# Patient Record
Sex: Male | Born: 1975
Health system: Southern US, Community
[De-identification: ages and names within clinical notes are randomized; demographics above are authoritative.]

## PROBLEM LIST (undated history)

## (undated) DIAGNOSIS — I10 Essential (primary) hypertension: Secondary | ICD-10-CM

## (undated) DIAGNOSIS — R030 Elevated blood-pressure reading, without diagnosis of hypertension: Secondary | ICD-10-CM

## (undated) HISTORY — DX: Elevated blood-pressure reading, without diagnosis of hypertension: R03.0

## (undated) HISTORY — DX: Essential (primary) hypertension: I10

---

## 2005-12-25 ENCOUNTER — Ambulatory Visit: Payer: Self-pay | Admitting: Family Medicine

## 2006-01-23 ENCOUNTER — Ambulatory Visit: Payer: Self-pay | Admitting: Internal Medicine

## 2006-02-02 ENCOUNTER — Ambulatory Visit: Payer: Self-pay | Admitting: Internal Medicine

## 2007-02-03 ENCOUNTER — Emergency Department (HOSPITAL_COMMUNITY): Admission: EM | Admit: 2007-02-03 | Discharge: 2007-02-03 | Payer: Self-pay | Admitting: Emergency Medicine

## 2007-10-25 ENCOUNTER — Ambulatory Visit: Payer: Self-pay | Admitting: Internal Medicine

## 2007-10-25 DIAGNOSIS — M542 Cervicalgia: Secondary | ICD-10-CM | POA: Insufficient documentation

## 2008-03-27 ENCOUNTER — Telehealth (INDEPENDENT_AMBULATORY_CARE_PROVIDER_SITE_OTHER): Payer: Self-pay | Admitting: *Deleted

## 2008-03-27 ENCOUNTER — Ambulatory Visit: Payer: Self-pay | Admitting: Internal Medicine

## 2008-03-27 DIAGNOSIS — R030 Elevated blood-pressure reading, without diagnosis of hypertension: Secondary | ICD-10-CM | POA: Insufficient documentation

## 2008-03-28 ENCOUNTER — Telehealth: Payer: Self-pay | Admitting: Internal Medicine

## 2008-05-16 ENCOUNTER — Ambulatory Visit: Payer: Self-pay | Admitting: Internal Medicine

## 2008-05-23 ENCOUNTER — Telehealth (INDEPENDENT_AMBULATORY_CARE_PROVIDER_SITE_OTHER): Payer: Self-pay | Admitting: *Deleted

## 2008-12-12 ENCOUNTER — Ambulatory Visit: Payer: Self-pay | Admitting: Internal Medicine

## 2008-12-21 ENCOUNTER — Telehealth: Payer: Self-pay | Admitting: Internal Medicine

## 2009-12-24 ENCOUNTER — Ambulatory Visit: Payer: Self-pay | Admitting: Internal Medicine

## 2010-10-28 ENCOUNTER — Ambulatory Visit: Payer: Self-pay | Admitting: Internal Medicine

## 2010-10-28 DIAGNOSIS — B372 Candidiasis of skin and nail: Secondary | ICD-10-CM | POA: Insufficient documentation

## 2010-10-29 LAB — CONVERTED CEMR LAB: Hgb A1c MFr Bld: 5.1 % (ref 4.6–6.5)

## 2010-12-11 ENCOUNTER — Ambulatory Visit: Payer: Self-pay | Admitting: Internal Medicine

## 2010-12-11 DIAGNOSIS — J329 Chronic sinusitis, unspecified: Secondary | ICD-10-CM | POA: Insufficient documentation

## 2010-12-12 ENCOUNTER — Telehealth: Payer: Self-pay | Admitting: Internal Medicine

## 2011-01-21 NOTE — Assessment & Plan Note (Signed)
Summary: JOCKEY ITCH//PH   Vital Signs:  Patient profile:   35 year old male Height:      68 inches Weight:      206.6 pounds BMI:     31.53 Temp:     98.3 degrees F oral Pulse rate:   56 / minute Resp:     12 per minute BP sitting:   124 / 86  (left arm) Cuff size:   large  Vitals Entered By: Shonna Chock CMA (October 28, 2010 1:36 PM) CC: Personal concerns , Rash, Type 2 diabetes mellitus follow-up   CC:  Personal concerns , Rash, and Type 2 diabetes mellitus follow-up.  History of Present Illness: Rash      This is a 35 year old man who presents with Rash X 3 months.  The patient reports  diffuse  redness, but denies tenderness.  The rash is located on the groin.  The rash is NOT  worse with heat or  worse with sweating.  The patient denies the following symptoms: fever, facial swelling, tongue swelling, abdominal pain, diarrhea, dysuria, eye symptoms, and arthralgias.  The patient denies history of recent infection or  new medication. Rx: [employer] "Tinactin", [place], Lamisil w/o benefit.  The patient denies polyuria, polydipsia, weight loss, and numbness of extremities.  The patient denies the following symptoms: neuropathic pain.  Father had DM.  Current Medications (verified): 1)  None  Allergies: 1)  ! Pcn 2)  ! Augmentin 3)  ! Prednisone  Physical Exam  General:  well-nourished,in no acute distress; alert,appropriate and cooperative throughout examination Abdomen:  Bowel sounds positive,abdomen soft and non-tender without masses, organomegaly . Small , reducible ventral  hernia  noted. Pulses:  R and L radial,dorsalis pedis and posterior tibial pulses are full and equal bilaterally Extremities:  No clubbing, cyanosis, edema. Good nail health Neurologic:  alert & oriented X3 and sensation intact to light touch over feet   Skin:  Mild erythema on scrotum Cervical Nodes:  No lymphadenopathy noted Axillary Nodes:  No palpable lymphadenopathy   Impression &  Recommendations:  Problem # 1:  CANDIDIASIS, SKIN (ICD-112.3)  Orders: Venipuncture (16109) TLB-A1C / Hgb A1C (Glycohemoglobin) (83036-A1C) Specimen Handling (60454)  Problem # 2:  DIABETES MELLITUS, FAMILY HX (ICD-V18.0)  Orders: Venipuncture (09811) TLB-A1C / Hgb A1C (Glycohemoglobin) (83036-A1C) Specimen Handling (91478)  Patient Instructions: 1)  Avoid High Fructose Corn Syrup Corn Syrup ; consume < 40 grams of HFCS sugar/ day. Use hairdrier after applying Nizoral.   Orders Added: 1)  Est. Patient Level III [29562] 2)  Venipuncture [36415] 3)  TLB-A1C / Hgb A1C (Glycohemoglobin) [83036-A1C] 4)  Specimen Handling [99000]

## 2011-01-21 NOTE — Assessment & Plan Note (Signed)
Summary: sinus infection/kdc   Vital Signs:  Patient profile:   35 year old male Weight:      206 pounds Temp:     98.3 degrees F oral Pulse rate:   72 / minute Resp:     14 per minute BP sitting:   128 / 80  (left arm) Cuff size:   large  Vitals Entered By: Shonna Chock (December 24, 2009 12:16 PM) CC: Sinus Infection Symptoms off/on x 2 weeks Comments REVIEWED MED LIST, PATIENT AGREED DOSE AND INSTRUCTION CORRECT    CC:  Sinus Infection Symptoms off/on x 2 weeks.  History of Present Illness: Sick " on & off "  X 2 weeks; now with maxillary pain with green/ yellow discharge. Rx: Neti pot , Mucinex D.  Allergies: 1)  ! Pcn 2)  ! Augmentin 3)  ! Prednisone  Review of Systems General:  Denies chills, fever, and sweats. Eyes:  Denies discharge, eye pain, and red eye. ENT:  Complains of nasal congestion, postnasal drainage, and sinus pressure; denies ear discharge, earache, and sore throat; Some anosmia; am ST . No frontal headache . Resp:  Complains of cough; denies sputum productive; PNDrainage causes .  Physical Exam  General:  well-nourished,in no acute distress; alert,appropriate and cooperative throughout examination Eyes:  No corneal or conjunctival inflammation noted. Perrla. Ears:  External ear exam shows no significant lesions or deformities.  Otoscopic examination reveals clear canals, tympanic membranes are intact bilaterally without bulging, retraction, inflammation or discharge. Hearing is grossly normal bilaterally. Some wax Nose:  External nasal examination shows no deformity or inflammation. Nasal mucosa are pink and moist without lesions or exudates. Mouth:  Oral mucosa and oropharynx without lesions or exudates.  Teeth in good repair.Slight hoarseness Lungs:  Normal respiratory effort, chest expands symmetrically. Lungs are clear to auscultation, no crackles or wheezes. Cervical Nodes:  No lymphadenopathy noted Axillary Nodes:  No palpable  lymphadenopathy   Impression & Recommendations:  Problem # 1:  SINUSITIS- ACUTE-NOS (ICD-461.9)  The following medications were removed from the medication list:    Metronidazole 500 Mg Tabs (Metronidazole) .Marland Kitchen... 1 three times a day His updated medication list for this problem includes:    Smz-tmp Ds 800-160 Mg Tabs (Sulfamethoxazole-trimethoprim) .Marland Kitchen... 1 two times a day with 8 oz of water  Complete Medication List: 1)  Smz-tmp Ds 800-160 Mg Tabs (Sulfamethoxazole-trimethoprim) .Marland Kitchen.. 1 two times a day with 8 oz of water  Patient Instructions: 1)  Neti pot once daily - two times a day as needed . Plain Mucinex , not D 2)  Drink as much fluid as you can tolerate for the next few days. Prescriptions: SMZ-TMP DS 800-160 MG TABS (SULFAMETHOXAZOLE-TRIMETHOPRIM) 1 two times a day with 8 oz of water  #20 x 0   Entered and Authorized by:   Marga Melnick MD   Signed by:   Marga Melnick MD on 12/24/2009   Method used:   [facility]ed to ...       [facility] [facility] [address] [id number]       [address]       [place], [place]  [address]       Ph: [phone]       Fax: [phone]   RxID:   [id number]

## 2011-01-23 NOTE — Progress Notes (Signed)
Summary: ABX--ER not avail  Phone Note Refill Request Message from:  Pharmacy on December 12, 2010 3:17 PM  Refills Requested: Medication #1:  CLARITHROMYCIN 500 MG XR24H-TAB 2 once daily with a meal. ER is not avail, is it ok to dispense regular instead? Please advise.  Initial call taken by: Lucious Groves CMA,  December 12, 2010 3:17 PM  Follow-up for Phone Call        [facility] per MD, new prescription sent. Lucious Groves CMA  December 12, 2010 4:37 PM     New/Updated Medications: CLARITHROMYCIN 500 MG TABS (CLARITHROMYCIN) 2 by mouth once daily with a meal Prescriptions: CLARITHROMYCIN 500 MG TABS (CLARITHROMYCIN) 2 by mouth once daily with a meal  #20 x 0   Entered by:   Lucious Groves CMA   Authorized by:   Marga Melnick MD   Signed by:   Lucious Groves CMA on 12/12/2010   Method used:   [facility]        [facility]  [address] [address] [id number]       [address]       [place], [place]  [address]       Ph: [phone]       Fax: [phone]   RxID:   [id number]

## 2011-01-23 NOTE — Assessment & Plan Note (Signed)
Summary: SINUS/KN   Vital Signs:  Patient profile:   35 year old male Weight:      208.2 pounds BMI:     31.77 Temp:     98.4 degrees F oral Pulse rate:   60 / minute Resp:     14 per minute BP sitting:   138 / 96  (left arm) Cuff size:   large  Vitals Entered By: Shonna Chock CMA (December 11, 2010 10:45 AM) CC: Sinuses , URI symptoms   CC:  Sinuses  and URI symptoms.  History of Present Illness:      This is a 35 year old man who presents with URI symptoms; onset 12/10 as ST.  The patient now reports  scant  purulent nasal discharge with slight epistaxis, but denies productive cough and earache.  The patient denies fever, dyspnea, and wheezing.  The patient also reports slight headache.  Risk factors for Strep sinusitis include bilateral facial pain and tooth pain.  The patient denies the following risk factors for Strep sinusitis: tender adenopathy.  Rx: Neti pot two times a day   Current Medications (verified): 1)  None  Allergies: 1)  ! Pcn 2)  ! Augmentin 3)  ! Prednisone  Review of Systems Derm:  Complains of rash; Inguina rash no better with Nizoral two times a day .  Physical Exam  General:  in no acute distress; alert,appropriate and cooperative throughout examination Ears:  External ear exam shows no significant lesions or deformities.  Otoscopic examination reveals clear canals, tympanic membranes are intact bilaterally without bulging, retraction, inflammation or discharge. Hearing is grossly normal bilaterally. Nose:  External nasal examination shows no deformity or inflammation. Nasal mucosa  mildly erythematous without lesions or exudates. Septum to R Mouth:  Oral mucosa and oropharynx without lesions or exudates.  Teeth in good repair. Lungs:  Normal respiratory effort, chest expands symmetrically. Lungs are clear to auscultation, no crackles or wheezes. Skin:  faint rash in inguinal crease bilaterally Cervical Nodes:  No lymphadenopathy noted Axillary  Nodes:  No palpable lymphadenopathy   Impression & Recommendations:  Problem # 1:  SINUSITIS, ACUTE (ICD-461.9)  His updated medication list for this problem includes:    Clarithromycin 500 Mg Xr24h-tab (Clarithromycin) .Marland Kitchen... 2 once daily with a meal  Problem # 2:  CANDIDIASIS, SKIN (ICD-112.3)  no improvement with Nizoral; A1c WNL  Orders: Dermatology Referral (Derma)  Complete Medication List: 1)  Clarithromycin 500 Mg Xr24h-tab (Clarithromycin) .... 2 once daily with a meal  Patient Instructions: 1)  Drink as much NON  dairy  fluid as you can tolerate for the next few days. Prescriptions: CLARITHROMYCIN 500 MG XR24H-TAB (CLARITHROMYCIN) 2 once daily with a meal  #20 x 0   Entered and Authorized by:   Marga Melnick MD   Signed by:   Marga Melnick MD on 12/11/2010   Method used:   [facility]ed to ...       [facility] [id number]       [address]       [place], [place]  [address]       Ph: [phone]       Fax: [phone]   RxID:   [id number]    Orders Added: 1)  Est. Patient Level III [[address]] 2)  Dermatology Referral [Derma]

## 2011-02-28 ENCOUNTER — Encounter: Payer: Self-pay | Admitting: Internal Medicine

## 2011-10-08 ENCOUNTER — Encounter: Payer: Self-pay | Admitting: Internal Medicine

## 2011-10-08 ENCOUNTER — Ambulatory Visit (INDEPENDENT_AMBULATORY_CARE_PROVIDER_SITE_OTHER): Payer: BC Managed Care – PPO | Admitting: Internal Medicine

## 2011-10-08 VITALS — HR 69 | Temp 98.2°F | Wt 201.4 lb

## 2011-10-08 DIAGNOSIS — J0100 Acute maxillary sinusitis, unspecified: Secondary | ICD-10-CM

## 2011-10-08 MED ORDER — FLUTICASONE PROPIONATE 50 MCG/ACT NA SUSP: 1.0000 | NASAL | Status: DC

## 2011-10-08 MED ORDER — SULFAMETHOXAZOLE-TMP DS 800-160 MG PO TABS: 1.0000 | ORAL_TABLET | Freq: Two times a day (BID) | ORAL | Status: AC

## 2011-10-08 NOTE — Progress Notes (Signed)
  Subjective:    Patient ID: Julian Swanson, male    DOB: Dec 09, 1976, 35 y.o.   MRN: [id number]  HPI Respiratory tract infection Onset/symptoms:10/11 as PNDrainage Exposures (illness/environmental/extrinsic): cool weather  Progression of symptoms:to cough & head congestion Treatments/response:Neti pot; Advil Sinus & Allergy Present symptoms: Fever/chills/sweats:no Frontal headache:no Facial pain:yes Nasal purulence:slight yellow Sore throat:not now Dental pain:minor with waist flexion Lymphadenopathy:no Wheezing/shortness of breath:no Cough/sputum/hemoptysis:essentially NP cough, Associated extrinsic/allergic symptoms:itchy eyes/ sneezing:no Past medical history: Seasonal allergies:no/asthma:no Smoking history:never           Review of Systems     Objective:   Physical Exam General appearance is of good health and nourishment; no acute distress or increased work of breathing is present.  Minor  lymphadenopathy L neck; no  Axillary LA   Eyes: No conjunctival inflammation or lid edema is present.   Ears:  External ear exam shows no significant lesions or deformities.  Otoscopic examination reveals clear canals, tympanic membranes are intact bilaterally without bulging, retraction, inflammation or discharge.  Nose:  External nasal examination shows no deformity or inflammation. Nasal mucosa are dry without lesions or exudates. No septal dislocation or dislocation.No obstruction to airflow.   Oral exam: Dental hygiene is good; lips and gums are healthy appearing.There is no oropharyngeal erythema or exudate noted.     Heart:  Normal rate and regular rhythm. S1 and S2 normal without gallop,  click. Sharp  S4 w/o murmur Lungs:Chest clear to auscultation; no wheezes, rhonchi,rales ,or rubs present.No increased work of breathing.    Extremities:  No cyanosis, edema, or clubbing  noted    Skin: Warm & dry w/o jaundice or tenting.          Assessment & Plan:  #1  maxillary sinusitis, acute  #2 penicillin allergy Plan: See orders and recommendations

## 2011-10-08 NOTE — Patient Instructions (Signed)
Plain Mucinex for thick secretions ;force NON dairy fluids for next 48 hrs. Use a Neti pot daily as needed for sinus congestion 

## 2012-07-14 ENCOUNTER — Ambulatory Visit (INDEPENDENT_AMBULATORY_CARE_PROVIDER_SITE_OTHER): Payer: BC Managed Care – PPO | Admitting: Internal Medicine

## 2012-07-14 ENCOUNTER — Encounter: Payer: Self-pay | Admitting: Internal Medicine

## 2012-07-14 VITALS — BP 128/80 | HR 72 | Temp 98.4°F | Wt 202.8 lb

## 2012-07-14 DIAGNOSIS — J0120 Acute ethmoidal sinusitis, unspecified: Secondary | ICD-10-CM

## 2012-07-14 MED ORDER — SULFAMETHOXAZOLE-TRIMETHOPRIM 800-160 MG PO TABS: 1.0000 | ORAL_TABLET | Freq: Two times a day (BID) | ORAL | Status: AC

## 2012-07-14 NOTE — Patient Instructions (Addendum)
Plain Mucinex for thick secretions ;force NON dairy fluids . Use a Neti pot daily as needed for sinus congestion; going from open side to congested side . Nasal cleansing in the shower as discussed. Make sure that all residual soap is removed to prevent irritation.  Plain Allegra 160 daily as needed for itchy eyes & sneezing.    

## 2012-07-14 NOTE — Progress Notes (Signed)
  Subjective:    Patient ID: Julian Swanson, male    DOB: September 04, 1976, 36 y.o.   MRN: [id number]  HPI  He mowed his lawn 7/15; he awoke early the morning of 7/16 with a sore throat. This was associated with some postnasal drainage and progression of the sore throat over several days. This was also associated with malaise. As of 7/20 he had some hyponasal speech pattern. He describes facial pain and scant nasal purulence. He also has minimal sputum production, mainly in the morning.  He has been applying his Neti pot for nasal gavage.Advil congestion did not help    Review of Systems He denies frontal headache, itchy/watery eyes, sneezing. He is not having wheezing or shortness of breath.     Objective:   Physical Exam General appearance:good health ;well nourished; no acute distress or increased work of breathing is present.  No  lymphadenopathy about the head, neck, or axilla noted.   Eyes: No conjunctival inflammation or lid edema is present.   Ears:  External ear exam shows no significant lesions or deformities.  Otoscopic examination reveals clear canals, tympanic membranes are intact bilaterally without bulging, retraction, inflammation or discharge.  Nose:  External nasal examination shows no deformity or inflammation. Nasal mucosa are pink and moist without lesions or exudates. R septal deviation.No obstruction to airflow.   Oral exam: Dental hygiene is good; lips and gums are healthy appearing.There is no oropharyngeal erythema or exudate noted.    Heart:  Normal rate and regular rhythm. S1 and S2 normal without gallop,  click, rub.S4;Grade 1/2-1 over 6 systolic murmur  Lungs:Chest clear to auscultation; no wheezes, rhonchi,rales ,or rubs present.No increased work of breathing.    Extremities:  No cyanosis, edema, or clubbing  noted    Skin: Warm & dry           Assessment & Plan:  #1 upper respiratory tract infection; rule out ethmoid sinusitis.  Plan: See orders and  recommendations

## 2013-11-11 ENCOUNTER — Telehealth: Payer: Self-pay | Admitting: Internal Medicine

## 2013-11-11 NOTE — Telephone Encounter (Signed)
Patient called and wanted dr hopper recommendations on a good Cardiologist for his father.

## 2013-11-15 NOTE — Telephone Encounter (Signed)
Spoke with the pt and informed him of Dr. Frederik Pear note below.  Pt understood and to tell Dr. Alwyn Ren he said Thank You.//AB/CMA

## 2013-11-15 NOTE — Telephone Encounter (Signed)
It depends on the nature of his heart problem;ie rhythm problem vs coronary artery disease. All heart subspecialists are in the [facility] group now so his father would be directed to the correct specialist based upon review of his medical records.

## 2013-11-15 NOTE — Telephone Encounter (Signed)
Please advise.//AB/CMA 

## 2013-12-12 ENCOUNTER — Telehealth: Payer: Self-pay | Admitting: *Deleted

## 2013-12-12 NOTE — Telephone Encounter (Signed)
error 

## 2013-12-13 ENCOUNTER — Encounter: Payer: Self-pay | Admitting: Internal Medicine

## 2013-12-13 ENCOUNTER — Ambulatory Visit (INDEPENDENT_AMBULATORY_CARE_PROVIDER_SITE_OTHER): Payer: BC Managed Care – PPO | Admitting: Internal Medicine

## 2013-12-13 VITALS — BP 138/81 | HR 79 | Temp 98.2°F | Wt 212.2 lb

## 2013-12-13 DIAGNOSIS — J209 Acute bronchitis, unspecified: Secondary | ICD-10-CM

## 2013-12-13 MED ORDER — HYDROCODONE-HOMATROPINE 5-1.5 MG/5ML PO SYRP: 5.0000 mL | ORAL_SOLUTION | Freq: Four times a day (QID) | ORAL | Status: DC | PRN

## 2013-12-13 MED ORDER — AZITHROMYCIN 250 MG PO TABS: ORAL_TABLET | ORAL | Status: DC

## 2013-12-13 NOTE — Progress Notes (Signed)
Pre visit review using our clinic review tool, if applicable. No additional management support is needed unless otherwise documented below in the visit note. 

## 2013-12-13 NOTE — Patient Instructions (Signed)

## 2013-12-13 NOTE — Progress Notes (Signed)
   Subjective:    Patient ID: Julian Swanson, male    DOB: [birth year], 37 y.o.   MRN: [id number]  HPI   He has had variable symptoms over 3 weeks span. Initially had a dry cough which has waxed and waned. As of today he is having some green/yellow sputum. He's also had some scant purulent material from his sinuses.  Actually has had some sinus pressure in the frontal & axillary areas for several months related to changes in barometric pressure and temperature.  The symptoms have been improved at times. On 9/15 he noted increased sensation of sensitivity of his forearms associated  with significant aching from neck down overnight. He has not had the flu shot.   Review of Systems   At this time he denies significant frontal or maxillary sinus pain. He also has no otic pain or discharge. He questions whether there might be some wheezing associated with the cough.Extrinsic symptoms are minor.     Objective:   Physical Exam General appearance:good health ;well nourished; no acute distress or increased work of breathing is present.  No  lymphadenopathy about the head, neck, or axilla noted.   Eyes: No conjunctival inflammation or lid edema is present.   Ears:  External ear exam shows no significant lesions or deformities.  Otoscopic examination reveals clear canals, tympanic membranes are intact bilaterally without bulging, retraction, inflammation or discharge.  Nose:  External nasal examination shows no deformity or inflammation. Nasal mucosa are pink and moist without lesions or exudates. No septal dislocation or deviation.No obstruction to airflow.   Oral exam: Dental hygiene is good; lips and gums are healthy appearing.There is no oropharyngeal erythema or exudate noted.   Neck:  No deformities, thyromegaly, masses, or tenderness noted.   Supple with full range of motion without pain.   Heart:  Normal rate and regular rhythm. S1 and S2 normal without gallop, murmur, click, rub or  other extra sounds.   Lungs:Chest clear to auscultation; no wheezes, rhonchi,rales ,or rubs present.No increased work of breathing.  Dry cough  Extremities:  No cyanosis, edema, or clubbing  noted    Skin: Damp         Assessment & Plan:  #1 bronchitis See orders

## 2013-12-19 ENCOUNTER — Telehealth: Payer: Self-pay | Admitting: Internal Medicine

## 2013-12-19 ENCOUNTER — Other Ambulatory Visit: Payer: Self-pay | Admitting: *Deleted

## 2013-12-19 MED ORDER — CLARITHROMYCIN ER 500 MG PO TB24: ORAL_TABLET | ORAL | Status: DC

## 2013-12-19 NOTE — Telephone Encounter (Signed)
Please advise 

## 2013-12-19 NOTE — Telephone Encounter (Signed)
Patient called stating he has finished antibiotics and is still not better. He would like to know if he needs another round or to come back into the office. [facility] uses [facility]

## 2013-12-19 NOTE — Telephone Encounter (Signed)
Medication called into pharmacy. JG//CMA

## 2013-12-19 NOTE — Telephone Encounter (Signed)
Generic Biaxin XL 500 #14 WITH A MEAL

## 2014-05-08 ENCOUNTER — Encounter: Payer: Self-pay | Admitting: Internal Medicine

## 2014-05-08 ENCOUNTER — Ambulatory Visit (INDEPENDENT_AMBULATORY_CARE_PROVIDER_SITE_OTHER): Payer: BC Managed Care – PPO | Admitting: Internal Medicine

## 2014-05-08 VITALS — BP 138/100 | HR 58 | Temp 98.3°F | Wt 208.8 lb

## 2014-05-08 DIAGNOSIS — J0100 Acute maxillary sinusitis, unspecified: Secondary | ICD-10-CM

## 2014-05-08 MED ORDER — SULFAMETHOXAZOLE-TMP DS 800-160 MG PO TABS: 1.0000 | ORAL_TABLET | Freq: Two times a day (BID) | ORAL | Status: DC

## 2014-05-08 NOTE — Progress Notes (Signed)
Pre visit review using our clinic review tool, if applicable. No additional management support is needed unless otherwise documented below in the visit note. 

## 2014-05-08 NOTE — Patient Instructions (Signed)

## 2014-05-08 NOTE — Progress Notes (Signed)
   Subjective:    Patient ID: Julian Swanson, male    DOB: 04-Aug-1976, 38 y.o.   MRN: [id number]  HPI   Symptoms began 04/27/14 as rhinitis and sore throat. Symptoms progressed to the point where he has had facial sinus pain associated with expectoration of yellow secretions with some blood tinging.   Purulence began  5/14.  He has used Tylenol Sinus and Sinus Pain Relief with only partial response   Review of Systems  He specifically denies significant frontal sinus pain, dental pain, otic pain, or otic discharge  The cough is related to postnasal drainage and is not associated with wheezing or shortness of breath  He has no associated fever, chills, or sweats  No extrinsic symptoms of itchy, watery eyes.       Objective:   Physical Exam General appearance:good health ;well nourished; no acute distress or increased work of breathing is present.  No  lymphadenopathy about the head, neck, or axilla noted.   Eyes: No conjunctival inflammation or lid edema is present. There is no scleral icterus.  Ears:  External ear exam shows no significant lesions or deformities.  Otoscopic examination reveals clear canals, tympanic membranes are intact bilaterally without bulging, retraction, inflammation or discharge.  Nose:  External nasal examination shows no deformity or inflammation. Nasal mucosa are dry without lesions or exudates. Slight septal deviation.Minimal obstruction to airflow.   Oral exam: Dental hygiene is good; lips and gums are healthy appearing.There is no oropharyngeal erythema or exudate noted.   Neck:  No deformities, thyromegaly, masses, or tenderness noted.   Supple with full range of motion without pain.   Heart:  Normal rate and regular rhythm. S1 and S2 normal without gallop, murmur, click, rub or other extra sounds.   Lungs:Chest clear to auscultation; no wheezes, rhonchi,rales ,or rubs present.No increased work of breathing.    Extremities:  No cyanosis, edema,  or clubbing  noted    Skin: Warm & dry          Assessment & Plan:  #1 rhinosinusitis without significant bronchitis  Plan: Nasal hygiene interventions discussed. See prescription medications

## 2014-05-19 ENCOUNTER — Ambulatory Visit (INDEPENDENT_AMBULATORY_CARE_PROVIDER_SITE_OTHER): Payer: BC Managed Care – PPO | Admitting: Internal Medicine

## 2014-05-19 ENCOUNTER — Encounter: Payer: Self-pay | Admitting: Internal Medicine

## 2014-05-19 VITALS — BP 146/90 | HR 81 | Temp 98.0°F | Wt 209.0 lb

## 2014-05-19 DIAGNOSIS — L500 Allergic urticaria: Secondary | ICD-10-CM

## 2014-05-19 MED ORDER — RANITIDINE HCL 150 MG PO TABS: 150.0000 mg | ORAL_TABLET | Freq: Two times a day (BID) | ORAL | Status: DC

## 2014-05-19 MED ORDER — METHYLPREDNISOLONE (PAK) 4 MG PO TABS: ORAL_TABLET | ORAL | Status: DC

## 2014-05-19 MED ORDER — HYDROXYZINE HCL 10 MG PO TABS: 10.0000 mg | ORAL_TABLET | Freq: Four times a day (QID) | ORAL | Status: DC | PRN

## 2014-05-19 NOTE — Progress Notes (Signed)
   Subjective:    Patient ID: Julian Swanson, male    DOB: [birth year], 38 y.o.   MRN: [id number]  HPI  He is completing his last day of generic Septra DS. As of 05/16/14 he began to have erythematous papules or urticarial type changes at the right wrist and elbow.  He questioned whether this might be related to working in yard with  possible poison ivy exposures;but he has not done yard work for 2 weeks .  After showering today he noted urticarial lesions around his waist.      Review of Systems  He denies itchy, watery eyes, sneezing  He's had no swelling of the lips or tongue  He denies any intraoral blisters or lesions  He has no shortness of breath, cough, or wheezing       Objective:   Physical Exam General appearance:good health ;well nourished; no acute distress or increased work of breathing is present.  No  lymphadenopathy about the head, neck, or axilla noted.   Eyes: No conjunctival inflammation or lid edema (see Skin below) is present. There is no scleral icterus.  Ears:  External ear exam shows no significant lesions or deformities.  Otoscopic examination reveals clear canals, tympanic membranes are intact bilaterally without bulging, retraction, inflammation or discharge.  Nose:  External nasal examination shows no deformity or inflammation. Nasal mucosa are pink and moist without lesions or exudates. No septal dislocation or deviation.No obstruction to airflow.   Oral exam: Dental hygiene is good; lips and gums are healthy appearing.There is no oropharyngeal erythema or exudate noted. No intraoral blisters.  Neck:  No deformities, thyromegaly, masses, or tenderness noted.   Supple with full range of motion without pain.   Heart:  Normal rate and regular rhythm. S1 and S2 normal without gallop, murmur, click, rub or other extra sounds.   Lungs:Chest clear to auscultation; no wheezes, rhonchi,rales ,or rubs present.No increased work of breathing.     Extremities:  No cyanosis, edema, or clubbing  noted    Skin: Warm & dry.  He has a classic urticaria circumferentially around the waist below his belt.  He has faint erythematous plaque-like lesions of the medial upper lids bilaterally.  He has scattered papular urticarial lesions at the right wrist & right elbow.  Dermatographia is elicitable.          Assessment & Plan:  #1 urticarial reaction to generic Septra DS .There is no evidence of Stevens-Johnson syndrome or angioedema phenomena.  The chart lists intolerance to prednisone. He has no knowledge of any reaction to prednisone in the past.  See orders and recommendations

## 2014-05-19 NOTE — Progress Notes (Signed)
   Subjective:    Patient ID: Julian Swanson, male    DOB: 11/17/1976, 38 y.o.   MRN: [id number]  HPI Pt reports the hives appeared 05/17/14.  Today is D9 of 10 of Bactrim that was prescribed to the patient last week for a URI.  The pt is not aware of a sulfa allergy. The hives are diffusely scattered across his body and more recently his face.  The hives are itchy. The problem has worsened over the past two days. He has not reported any angioedema or SOB. He does report a non-productive cough that started last night. Pt took children's benadryl last night and had only mild relief. The pt also reports he had some indigestion last night at bedtime and felt "gassy". The patient did not eat any spicy or fatty foods last night, and denies consuming any known aggravating foods. This problem is persisting this morning but seems to be better. The pt denies any exposures to plants, animals, or new clothing.   Review of Systems Resp: Denies SOB  Immune: denies angioedema    Objective:   Physical Exam Gen.: Healthy and well-nourished in appearance. Alert, appropriate and cooperative throughout exam. Head: Normocephalic without obvious abnormalities Ears: External  ear exam reveals no significant lesions or deformities. Canals clear .TMs normal. Hearing is grossly normal bilaterally. Nose: External nasal exam reveals no deformity or inflammation. Nasal mucosa are pink and moist. No lesions or exudates noted.   Mouth: Oral mucosa and oropharynx reveal no lesions or exudates. Teeth in good repair. Neck: No deformities, masses, or tenderness noted. Range of motion. Lungs: Normal respiratory effort; chest expands symmetrically. Lungs are clear to auscultation without rales, wheezes, or increased work of breathing. Heart: Normal rate and rhythm. Split S1 and S2. No gallop, click, or rub. No murmur. Neurologic: Alert and oriented x3     Skin: diffuse hives located on elbows, wrists, waistline, face, eyelids  and head.  Lymph: No cervical, axillary, or inguinal lymphadenopathy present. Psych: Mood and affect are normal. Normally interactive                                                                                   Assessment & Plan:  #1 allergic reaction; most likely due to Bactrim.   #2 SJS; no oral lesions indicative   Pt to stop taking bactrim. Will give a course of steroids, zantac, and hydroxyzine. Pt has prednisone listed as an allergy but does not recall his reaction in the past. Will check with pharmacist. In the meantime will give medrol pack. Advised pt to report to nearest ED if symptoms persist or if pt notes new reaction with introduction of steroids.

## 2014-05-19 NOTE — Patient Instructions (Addendum)
Avoid  cosmetics which are not hypoallergenic. Restrict hyperallergenic foods at this time: Nuts, strawberries, seafood , chocolate, and tomatoes. Because of a history of documented adverse serious drug reaction;Medi Alert bracelet  is recommended

## 2014-05-19 NOTE — Progress Notes (Signed)
Pre visit review using our clinic review tool, if applicable. No additional management support is needed unless otherwise documented below in the visit note. 

## 2014-05-22 ENCOUNTER — Telehealth: Payer: Self-pay | Admitting: *Deleted

## 2014-05-22 NOTE — Telephone Encounter (Signed)
[facility] Triage Call Report Triage Record Num: [id number] Operator: Alphonsa Overall Patient Name: Julian Swanson Call Date & Time: 05/19/2014 12:07:06AM Patient Phone: [phone] PCP: Marga Melnick Patient Gender: Male PCP Fax : [phone] Patient DOB: May 20, 1976 Practice Name: [facility] Reason for Call: Pt calling PCP: Marga Melnick; CB#: [phone]; Call regarding Rash/Hives on abx Sulfamethoxazole since 05/08/14 for sinuses-improved. Onset rash 05/17/14. Worsening 05/18/14. Mosquito bite like rash. New onset hives after new medication.Call within 4hours as needed. Protocol(s) Used: Hives Recommended Outcome per Protocol: Call Provider within 4 Hours Reason for Outcome: New onset of hives after beginning new prescribed, nonprescribed, or alternative/complementary medication Care Advice: Cool/tepid showers or baths may help relieve itching. If cool water alone does not relieve itching, try adding 1/2 to 1 cup baking soda or colloidal oatmeal (Aveeno) to bath water. ~ ~ Speak with provider before next dose of medication is due. ~ Should not be alone for the next 24 hours in case symptoms worsen. ~ IMMEDIATE ACTION ~ List, or take, all current prescription(s), nonprescription or alternative medication(s) to provider for evaluation. If an allergy is identified, tell all healthcare providers of your allergy. Even if a first-time reaction caused mild symptoms, a future response to the same allergen may cause more serious symptoms. Wear medical identification to alert others in case of an emergency. ~ Call EMS 911 if develop signs and symptoms of anaphylaxis within minutes to several hours of exposure: severe difficulty breathing; rapid, weak or irregular pulse; pruritus, urticaria, swelling of face, lips, tongue, or throat causing tightness or difficulty swallowing; abdominal cramping, nausea, vomiting or diarrhea. ~ If immediately available, consider taking an appropriate dose of  a nonprescription antihistamine (e.g., Benadryl) orally as directed by the label or a pharmacist. These medications may cause drowsiness and should be taken with caution by adults 75 years or older. ~ 05/19/2014 12:19:49AM Page 1 of 1 [id number]

## 2014-05-25 ENCOUNTER — Telehealth: Payer: Self-pay | Admitting: *Deleted

## 2014-05-25 NOTE — Telephone Encounter (Signed)
Pt called states he has completed the Steroid Rx, he is requesting whether he needs to continue the Hydroxyzine and Zantac Rx.  Please advise

## 2014-05-25 NOTE — Telephone Encounter (Signed)
Spoke with pt advised of MDs message 

## 2014-05-25 NOTE — Telephone Encounter (Signed)
Not if rash & itching gone

## 2014-12-13 ENCOUNTER — Ambulatory Visit (INDEPENDENT_AMBULATORY_CARE_PROVIDER_SITE_OTHER): Payer: BC Managed Care – PPO | Admitting: Internal Medicine

## 2014-12-13 ENCOUNTER — Encounter: Payer: Self-pay | Admitting: Internal Medicine

## 2014-12-13 ENCOUNTER — Other Ambulatory Visit (INDEPENDENT_AMBULATORY_CARE_PROVIDER_SITE_OTHER): Payer: BC Managed Care – PPO

## 2014-12-13 VITALS — BP 150/110 | HR 79 | Temp 98.1°F | Ht 68.0 in | Wt 207.5 lb

## 2014-12-13 DIAGNOSIS — R030 Elevated blood-pressure reading, without diagnosis of hypertension: Secondary | ICD-10-CM

## 2014-12-13 LAB — BASIC METABOLIC PANEL
BUN: 15 mg/dL (ref 6–23)
CHLORIDE: 102 meq/L (ref 96–112)
CO2: 29 mEq/L (ref 19–32)
CREATININE: 1.1 mg/dL (ref 0.4–1.5)
Calcium: 9.7 mg/dL (ref 8.4–10.5)
GFR: 75.43 mL/min (ref >60.00)
Glucose, Bld: 84 mg/dL (ref 70–99)
POTASSIUM: 4.2 meq/L (ref 3.5–5.1)
Sodium: 137 mEq/L (ref 135–145)

## 2014-12-13 MED ORDER — METOPROLOL TARTRATE 25 MG PO TABS: 25.0000 mg | ORAL_TABLET | Freq: Two times a day (BID) | ORAL | Status: DC

## 2014-12-13 NOTE — Progress Notes (Signed)
   Subjective:    Patient ID: Julian Swanson, male    DOB: [birth year], 38 y.o.   MRN: [id number]  HPI  He [facility] because of a blood pressure of 154/110.  BP was checked at work and was 162/120.  He has started a natural product to treat HTN  He is asymptomatic.  Both parents had hypertension. There is no family history heart attack or stroke.  Review of Systems   Chest pain, palpitations, tachycardia, exertional dyspnea, paroxysmal nocturnal dyspnea, claudication or edema are absent.      Objective:   Physical Exam  Positive pertinent findings include: He is a small epigastric ventral hernia which is partially reducible.  Appears healthy and well-nourished & in no acute distress  No carotid bruits are present.No neck vein distention present at 10 - 15 degrees. Thyroid normal to palpation  Heart rhythm and rate are normal with no gallop or murmur  Chest is clear with no increased work of breathing  There is no evidence of aortic aneurysm or renal artery bruits  Abdomen soft with no organomegaly or masses. No HJR  No clubbing, cyanosis or edema present.  Pedal pulses are intact   No ischemic skin changes are present . Fingernails healthy   Alert and oriented. Strength, tone, DTRs reflexes normal        Assessment & Plan:  #1 elevated blood pressure without prior history of hypertension  Plan: See orders and recommendations

## 2014-12-13 NOTE — Patient Instructions (Signed)

## 2014-12-13 NOTE — Progress Notes (Signed)
Pre visit review using our clinic review tool, if applicable. No additional management support is needed unless otherwise documented below in the visit note. 

## 2014-12-14 ENCOUNTER — Telehealth: Payer: Self-pay | Admitting: Internal Medicine

## 2014-12-14 NOTE — Telephone Encounter (Signed)
Exercise is fine; I am not familiar with the supplement.It is probably OK unless BP fails to get to goal of average < 140/90.

## 2014-12-14 NOTE — Telephone Encounter (Signed)
Pt seen yesterday, forgot to ask if exercise is ok? Basketball and walking? Pt also failed to mention he is taking "Michael's Natural Pathic Blood Pressure Factors", a vitamin/mineral/herb supplement - can he take this with newly prescribed medications? Pls advise.

## 2014-12-14 NOTE — Telephone Encounter (Signed)
Patient has been advised via voicemail  

## 2015-08-20 ENCOUNTER — Encounter: Payer: Self-pay | Admitting: Internal Medicine

## 2015-09-10 ENCOUNTER — Other Ambulatory Visit (INDEPENDENT_AMBULATORY_CARE_PROVIDER_SITE_OTHER): Payer: BLUE CROSS/BLUE SHIELD

## 2015-09-10 ENCOUNTER — Ambulatory Visit (INDEPENDENT_AMBULATORY_CARE_PROVIDER_SITE_OTHER): Payer: BLUE CROSS/BLUE SHIELD | Admitting: Internal Medicine

## 2015-09-10 ENCOUNTER — Encounter: Payer: Self-pay | Admitting: Internal Medicine

## 2015-09-10 VITALS — BP 135/80 | HR 68 | Temp 98.3°F | Resp 16 | Ht 68.0 in | Wt 204.0 lb

## 2015-09-10 DIAGNOSIS — Z0000 Encounter for general adult medical examination without abnormal findings: Secondary | ICD-10-CM

## 2015-09-10 DIAGNOSIS — Z0189 Encounter for other specified special examinations: Secondary | ICD-10-CM | POA: Diagnosis not present

## 2015-09-10 LAB — BASIC METABOLIC PANEL
BUN: 15 mg/dL (ref 6–23)
CO2: 28 mEq/L (ref 19–32)
Calcium: 9.8 mg/dL (ref 8.4–10.5)
Chloride: 107 mEq/L (ref 96–112)
Creatinine, Ser: 1.15 mg/dL (ref 0.40–1.50)
GFR: 74.4 mL/min (ref >60.00)
Glucose, Bld: 97 mg/dL (ref 70–99)
Potassium: 4.7 mEq/L (ref 3.5–5.1)
SODIUM: 142 meq/L (ref 135–145)

## 2015-09-10 LAB — HEPATIC FUNCTION PANEL
ALT: 15 U/L (ref 0–53)
AST: 19 U/L (ref 0–37)
Albumin: 4.4 g/dL (ref 3.5–5.2)
Alkaline Phosphatase: 42 U/L (ref 39–117)
BILIRUBIN TOTAL: 0.9 mg/dL (ref 0.2–1.2)
Bilirubin, Direct: 0.2 mg/dL (ref 0.0–0.3)
TOTAL PROTEIN: 7.3 g/dL (ref 6.0–8.3)

## 2015-09-10 LAB — LIPID PANEL
CHOL/HDL RATIO: 3
CHOLESTEROL: 179 mg/dL (ref 0–200)
HDL: 54.1 mg/dL (ref >39.00)
LDL CALC: 106 mg/dL — AB (ref 0–99)
NonHDL: 125.1
Triglycerides: 94 mg/dL (ref 0.0–149.0)
VLDL: 18.8 mg/dL (ref 0.0–40.0)

## 2015-09-10 LAB — CBC WITH DIFFERENTIAL/PLATELET
BASOS PCT: 0.8 % (ref 0.0–3.0)
Basophils Absolute: 0 10*3/uL (ref 0.0–0.1)
EOS PCT: 2.4 % (ref 0.0–5.0)
Eosinophils Absolute: 0.1 10*3/uL (ref 0.0–0.7)
HCT: 49.1 % (ref 39.0–52.0)
Hemoglobin: 16.7 g/dL (ref 13.0–17.0)
Lymphocytes Relative: 32.9 % (ref 12.0–46.0)
Lymphs Abs: 1.8 10*3/uL (ref 0.7–4.0)
MCHC: 34 g/dL (ref 30.0–36.0)
MCV: 92.7 fl (ref 78.0–100.0)
MONOS PCT: 8.7 % (ref 3.0–12.0)
Monocytes Absolute: 0.5 10*3/uL (ref 0.1–1.0)
Neutro Abs: 2.9 10*3/uL (ref 1.4–7.7)
Neutrophils Relative %: 55.2 % (ref 43.0–77.0)
Platelets: 164 10*3/uL (ref 150.0–400.0)
RBC: 5.3 Mil/uL (ref 4.22–5.81)
RDW: 12.9 % (ref 11.5–15.5)
WBC: 5.3 10*3/uL (ref 4.0–10.5)

## 2015-09-10 LAB — TSH: TSH: 1.5 u[IU]/mL (ref 0.35–4.50)

## 2015-09-10 NOTE — Progress Notes (Signed)
   Subjective:    Patient ID: Julian Swanson, male    DOB: 03/29/1976, 39 y.o.   MRN: [id number]  HPI The patient is here for a physical to assess status of active health conditions.  PMH, FH, & Social History reviewed & updated.No change in FH as recorded.  He took the metoprolol for one month and monitored his blood pressure. He stopped it as  blood pressure was 120/80-135/ 85. There was no rise off the medication.  He has been exercising 3 times a week with weights for 30-40 minutes without associated cardio pulmonary symptoms. He does decrease intake of red meat, fried foods and salt. He does not drink or smoke. He has lost 6 pounds in the last month.  He has intermittent cough which he feels may be related to insidious postnasal drainage.Cough tends to be loose but nonproductive. Weather change can be a trigger.  He also has nocturia once nightly. He does note intermittent rectal itching.   Review of Systems  Chest pain, palpitations, tachycardia, exertional dyspnea, paroxysmal nocturnal dyspnea, claudication or edema are absent. No unexplained weight loss, abdominal pain, significant dyspepsia, dysphagia, melena, rectal bleeding, or persistently small caliber stools. Dysuria, pyuria, hematuria, frequency, or polyuria are denied. Change in hair, skin, nails denied. No bowel changes of constipation or diarrhea. No intolerance to heat or cold.    Objective:   Physical Exam  General appearance is one of good health and nourishment w/o distress. Pattern alopecia is present.  Eyes: No conjunctival inflammation or scleral icterus is present.  Oral exam: Dental hygiene is good; lips and gums are healthy appearing.There is no oropharyngeal erythema or exudate noted.   He has wax impactions bilaterally.  Nasal septum is deviated to the right. There is some erythema of the nasal mucosa.  Heart:  Normal rate and regular rhythm. S1 and S2 normal without gallop, murmur, click, rub or  other extra sounds     Lungs:Chest clear to auscultation; no wheezes, rhonchi,rales ,or rubs present.No increased work of breathing.   Abdomen: bowel sounds normal, soft and non-tender without masses, or organomegaly present. Reducible ventral hernia is noted.  No guarding or rebound .   GU/digital rectal exam: External hemorrhoidal tags present. Genitalia and prostate normal.  Musculoskeletal: Able to lie flat and sit up without help. Negative straight leg raising bilaterally. Gait normal  Skin:Warm & dry.  Intact without suspicious lesions or rashes ; no jaundice or tenting  Lymphatic: No lymphadenopathy is noted about the head, neck, axilla, or inguinal areas.       Assessment & Plan:  #1 comprehensive physical exam; no acute findings  Plan: see Orders  & Recommendations

## 2015-09-10 NOTE — Patient Instructions (Addendum)
Minimal Blood Pressure Goal= AVERAGE < 140/90;  Ideal is an AVERAGE < 135/85. This AVERAGE should be calculated from @ least 5-7 BP readings taken @ different times of day on different days of week. You should not respond to isolated BP readings , but rather the AVERAGE for that week .Please bring your  blood pressure cuff to office visits to verify that it is reliable.It  can also be checked against the blood pressure device at the pharmacy. Finger or wrist cuffs are not dependable; an arm cuff is.  Plain Mucinex (NOT D) for thick secretions ;force NON dairy fluids .   Nasal cleansing in the shower as discussed with lather of mild shampoo.After 10 seconds wash off lather while  exhaling through nostrils. Make sure that all residual soap is removed to prevent irritation.  Flonase OR Nasacort AQ 1 spray in each nostril twice a day as needed. Use the "crossover" technique into opposite nostril spraying toward opposite ear @ 45 degree angle, not straight up into nostril.  Plain Allegra (NOT D )  160 daily , Loratidine 10 mg , OR Zyrtec 10 mg @ bedtime  as needed for itchy eyes & sneezing.  Please do not use Q-tips as we discussed. Should wax build up occur, please put 2-3 drops of mineral oil in the affected  ear at night to soften the wax .Cover the canal with a  cotton ball to prevent the oil from staining bed linens. In the morning fill the ear canal with hydrogen peroxide & lie in the opposite lateral decubitus position(on the side opposite the affected ear)  for 10-15 minutes. After allowing this period of time for the peroxide to dissolve the wax ;shower and use the thinnest washrag available to wick out the wax. If both ears are involved ; alternate this treatment from ear to ear each night until no wax is found on the washrag.  Cleanse rectal area with lather of mild shampoo in shower  as discussed. After bowel movement,use tissue to cleanse the bulk of stool & finish up with TUCKS  or Baby Wipes.   Sitz baths followed by the  Medication 2 to 3 times a day to shrink the hemorrhoids. Stay well hydrated and avoid popcorn and some other materials which might aggravate hemorrhoids.  Your next office appointment will be determined based upon review of your pending labs .  Those written interpretation of the lab results and instructions will be transmitted to you by mail for your records.  Critical results will be called.   Followup as needed for any active or acute issue. Please report any significant change in your symptoms.

## 2015-09-10 NOTE — Progress Notes (Signed)
Pre visit review using our clinic review tool, if applicable. No additional management support is needed unless otherwise documented below in the visit note. 

## 2015-10-09 ENCOUNTER — Telehealth: Payer: Self-pay | Admitting: Internal Medicine

## 2015-10-09 NOTE — Telephone Encounter (Signed)
Patient Name: Julian Swanson  DOB: 1976/05/14    Initial Comment Caller states ear is clogged; was seen month ago; did not do anything about the wax buildup; 2 wks ago went swimming, then began practicing cleaning method provided; RT ear is still giving problems; mineral oil and peroxide; *today only primary number;    Nurse Assessment  Nurse: Sherilyn CooterHenry, RN, Thurmond ButtsWade Date/Time (Eastern Time): 10/09/2015 11:28:52 AM  Confirm and document reason for call. If symptomatic, describe symptoms. ---Caller states that he went swimming a couple weeks ago. He has been cleaning the ear as recommended by the doctor. Friday night, his right ear stopped up. Worse Saturday morning. Has been like that since then. Denies pain, but at times there is some pressure. He has some diminished hearing in this ear. He is walking normally.  Has the patient traveled out of the country within the last 30 days? ---No  Does the patient have any new or worsening symptoms? ---Yes  Will a triage be completed? ---Yes  Related visit to physician within the last 2 weeks? ---No  Does the PT have any chronic conditions? (i.e. diabetes, asthma, etc.) ---No     Guidelines    Guideline Title Affirmed Question Affirmed Notes  Ear - Congestion Ear congestion present > 48 hours    Final Disposition User   See PCP When Office is Open (within 3 days) Sherilyn CooterHenry, RN, Thurmond ButtsWade    Comments  Appointment scheduled for tomorrow, Wednesday, for 4:00pm with Dr. Marga MelnickWilliam Hopper.   Disagree/Comply: Comply

## 2015-10-10 ENCOUNTER — Encounter: Payer: Self-pay | Admitting: Internal Medicine

## 2015-10-10 ENCOUNTER — Ambulatory Visit (INDEPENDENT_AMBULATORY_CARE_PROVIDER_SITE_OTHER): Payer: BLUE CROSS/BLUE SHIELD | Admitting: Internal Medicine

## 2015-10-10 VITALS — BP 140/95 | HR 86 | Temp 98.6°F | Ht 68.0 in | Wt 207.8 lb

## 2015-10-10 DIAGNOSIS — H6121 Impacted cerumen, right ear: Secondary | ICD-10-CM

## 2015-10-10 DIAGNOSIS — R030 Elevated blood-pressure reading, without diagnosis of hypertension: Secondary | ICD-10-CM

## 2015-10-10 NOTE — Progress Notes (Signed)
Pre visit review using our clinic review tool, if applicable. No additional management support is needed unless otherwise documented below in the visit note. 

## 2015-10-10 NOTE — Progress Notes (Signed)
   Subjective:    Patient ID: Julian Swanson, male    DOB: Jun 30, 1976, 39 y.o.   MRN: [id number]  HPI he went swimming in a pool 2 weeks ago and had fullness in both ears since. He used a wax removal protocol we have discussed and there was clearing on the left. The right has cleared only intermittently  since. He has noted some muffled sensation to hearing but no significant hearing loss or tinnitus.  He has no associated upper respiratory tract infection symptoms  Blood pressure at home averages 135/85. He does admit to a whitecoat syndrome.  Review of Systems Frontal headache, facial pain , nasal purulence, dental pain, sore throat , otic pain or otic discharge denied. No fever , chills or sweats..        Physical Exam  Objective: General appearance:Adequately nourished; no acute distress or increased work of breathing is present.    Lymphatic: No  lymphadenopathy about the head, neck, or axilla .  Eyes: No conjunctival inflammation or lid edema is present. There is no scleral icterus.  Ears:  External ear exam shows no significant lesions or deformities.  Otoscopic examination reveals wax partially obscuring the right tympanic membrane. The left temporal membrane is slightly dull without exudate or inflammation.  Nose:  External nasal examination shows no deformity or inflammation. Nasal mucosa are pink and moist without lesions or exudates No septal dislocation or deviation.No obstruction to airflow.   Oral exam: Dental hygiene is good; lips and gums are healthy appearing.There is no oropharyngeal erythema or exudate .  Neck:  No deformities, thyromegaly, masses, or tenderness noted.   Supple with full range of motion without pain.   Heart:  Normal rate and regular rhythm. S1 and S2 normal without gallop, murmur, click, rub or other extra sounds.   Lungs:Chest clear to auscultation; no wheezes, rhonchi,rales ,or rubs present.  Extremities:  No cyanosis, edema, or clubbing   noted    Skin: Warm & dry w/o tenting or jaundice. No significant lesions or rash.        Assessment & Plan:   #1 partial cerumen impaction on the right  #2 hypertension; home readings are at goal. He has a whitecoat syndrome.  See after visit summary

## 2015-10-10 NOTE — Patient Instructions (Signed)
Please do not use Q-tips as we discussed. Should wax build up occur, please put 2-3 drops of mineral oil in the affected  ear at night to soften the wax .Cover the canal with a  cotton ball to prevent the oil from staining bed linens. In the morning fill the ear canal with hydrogen peroxide & lie in the opposite lateral decubitus position(on the side opposite the affected ear)  for 10-15 minutes. After allowing this period of time for the peroxide to dissolve the wax ;shower and use the thinnest washrag available to wick out the wax. If both ears are involved ; alternate this treatment from ear to ear each night until no wax is found on the washrag.  Go to [facility] for eustachian tube dysfunction. Drink thin  fluids liberally through the day and chew sugarless gum . Do the Valsalva maneuver several times a day to "pop" ears open.Flonase  Or Nasacort AQ 1 spray in each nostril twice a day as needed. Use the "crossover" technique as discussed.

## 2016-10-28 ENCOUNTER — Ambulatory Visit (INDEPENDENT_AMBULATORY_CARE_PROVIDER_SITE_OTHER): Payer: 59 | Admitting: Family

## 2016-10-28 ENCOUNTER — Encounter: Payer: Self-pay | Admitting: Family

## 2016-10-28 VITALS — BP 168/110 | HR 97 | Temp 98.1°F | Resp 16 | Ht 68.0 in | Wt 218.0 lb

## 2016-10-28 DIAGNOSIS — J0100 Acute maxillary sinusitis, unspecified: Secondary | ICD-10-CM

## 2016-10-28 DIAGNOSIS — I10 Essential (primary) hypertension: Secondary | ICD-10-CM | POA: Diagnosis not present

## 2016-10-28 MED ORDER — DOXYCYCLINE HYCLATE 100 MG PO TABS: 100.0000 mg | ORAL_TABLET | Freq: Two times a day (BID) | ORAL | 0 refills | Status: DC

## 2016-10-28 MED ORDER — METOPROLOL SUCCINATE ER 25 MG PO TB24: 25.0000 mg | ORAL_TABLET | Freq: Every day | ORAL | 1 refills | Status: DC

## 2016-10-28 MED ORDER — METOPROLOL TARTRATE 25 MG PO TABS: 25.0000 mg | ORAL_TABLET | Freq: Two times a day (BID) | ORAL | 2 refills | Status: DC

## 2016-10-28 NOTE — Progress Notes (Signed)
Subjective:    Patient ID: Julian Eric Thong, male    DOB: Aug 26, 1976, 40 y.o.   MRN: [id number]  Chief Complaint  Patient presents with  . Cough    x1 week, sinus drainage, sore throat, and cough    HPI:  Julian Swanson is a 40 y.o. male who  has a past medical history of Elevated blood pressure reading without diagnosis of hypertension. and presents today for an acute office visit.  This is a new problem. Associated symptoms of sinus drainage, sore throat and cough have been going on for about 1 week. Denies fevers. Modifying factors include Coricidin HBP and natural remedies which have helped to maintain. Course of the symptoms appear to be improved since initial onset. Has significant history of sinus infections in the past. No recent antibiotics.    Allergies  Allergen Reactions  . Amoxicillin-Pot Clavulanate     Rash on thighs  . Penicillins     See Augmentin reaction  . Sulfa Antibiotics     05/19/14 urticaria  . Prednisone     No definitive reaction 05/19/14 known reaction denied      Outpatient Medications Prior to Visit  Medication Sig Dispense Refill  . metoprolol tartrate (LOPRESSOR) 25 MG tablet Take 1 tablet (25 mg total) by mouth 2 (two) times daily. 60 tablet 2   No facility-administered medications prior to visit.       No past surgical history on file.    Past Medical History:  Diagnosis Date  . Elevated blood pressure reading without diagnosis of hypertension    Whitecoat syndrome      Review of Systems  Constitutional: Negative for chills and fever.  HENT: Positive for congestion, sinus pressure and sore throat.   Respiratory: Positive for cough.   Neurological: Negative for headaches.      Objective:    BP (!) 168/110 (BP Location: Left Arm, Patient Position: Sitting, Cuff Size: Normal)   Pulse 97   Temp 98.1 F (36.7 C) (Oral)   Resp 16   Ht 5\' 8"  (1.727 m)   Wt 218 lb (98.9 kg)   SpO2 97%   BMI 33.15 kg/m  Nursing note  and vital signs reviewed.   Physical Exam  Constitutional: He is oriented to person, place, and time. He appears well-developed and well-nourished. No distress.  HENT:  Right Ear: Hearing, tympanic membrane, external ear and ear canal normal.  Left Ear: Hearing, tympanic membrane, external ear and ear canal normal.  Nose: Nose normal.  Mouth/Throat: Uvula is midline, oropharynx is clear and moist and mucous membranes are normal.  Cardiovascular: Normal rate, regular rhythm, normal heart sounds and intact distal pulses.   Pulmonary/Chest: Effort normal and breath sounds normal.  Neurological: He is alert and oriented to person, place, and time.  Skin: Skin is warm and dry.  Psychiatric: He has a normal mood and affect. His behavior is normal. Judgment and thought content normal.       Assessment & Plan:   Problem List Items Addressed This Visit      Cardiovascular and Mediastinum   Essential hypertension    Multiple readings of high blood pressure and previously maintained on metoprolol. Denies worse headache of life with no symptoms of end organ damage noted upon physical exam. Restart metoprolol. Continue to monitor blood pressure at home as able and follow low-sodium diet. Information provided for Dash eating plan. Continue to monitor.      Relevant Medications   metoprolol succinate (  TOPROL-XL) 25 MG 24 hr tablet     Respiratory   Sinusitis - Primary    Symptoms and exam consistent with sinusitis of the given most recent improvements appears to be favoring viral infection. Continue over-the-counter medications as needed for symptom relief and supportive care. Written prescription for doxycycline provided with instructions for watchful waiting if symptoms do worsen in the next 3-5 days. Follow-up if symptoms worsen or fail to improve.      Relevant Medications   doxycycline (VIBRA-TABS) 100 MG tablet       I have discontinued Mr. Worthy FlankWinfree's metoprolol tartrate and metoprolol  tartrate. I am also having him start on doxycycline and metoprolol succinate.   Meds ordered this encounter  Medications  . doxycycline (VIBRA-TABS) 100 MG tablet    Sig: Take 1 tablet (100 mg total) by mouth 2 (two) times daily.    Dispense:  20 tablet    Refill:  0    Order Specific Question:   Supervising Provider    Answer:   Hillard DankerRAWFORD, ELIZABETH A [[id number]]  . DISCONTD: metoprolol tartrate (LOPRESSOR) 25 MG tablet    Sig: Take 1 tablet (25 mg total) by mouth 2 (two) times daily.    Dispense:  60 tablet    Refill:  2    Order Specific Question:   Supervising Provider    Answer:   Hillard DankerRAWFORD, ELIZABETH A [[id number]]  . metoprolol succinate (TOPROL-XL) 25 MG 24 hr tablet    Sig: Take 1 tablet (25 mg total) by mouth daily.    Dispense:  30 tablet    Refill:  1    Order Specific Question:   Supervising Provider    Answer:   Hillard DankerRAWFORD, ELIZABETH A [[id number]]     Follow-up: Return in about 1 month (around 11/27/2016), or if symptoms worsen or fail to improve.  Jeanine Luzalone, Gregory, FNP

## 2016-10-28 NOTE — Patient Instructions (Addendum)
Thank you for choosing [facility].  SUMMARY AND INSTRUCTIONS:  Medication:  Your prescription(s) have been submitted to your pharmacy or been printed and provided for you. Please take as directed and contact our office if you believe you are having problem(s) with the medication(s) or have any questions.  Follow up:  If your symptoms worsen or fail to improve, please contact our office for further instruction, or in case of emergency go directly to the emergency room at the closest medical facility.     General Recommendations:    Please drink plenty of fluids.  Get plenty of rest   Sleep in humidified air  Use saline nasal sprays  Netti pot   OTC Medications:  Decongestants - helps relieve congestion   Flonase (generic fluticasone) or Nasacort (generic triamcinolone) - please make sure to use the "cross-over" technique at a 45 degree angle towards the opposite eye as opposed to straight up the nasal passageway.   Sudafed (generic pseudoephedrine - Note this is the one that is available behind the pharmacy counter); Products with phenylephrine (-PE) may also be used but is often not as effective as pseudoephedrine.   If you have HIGH BLOOD PRESSURE - Coricidin HBP; AVOID any product that is -D as this contains pseudoephedrine which may increase your blood pressure.  Afrin (oxymetazoline) every 6-8 hours for up to 3 days.   Allergies - helps relieve runny nose, itchy eyes and sneezing   Claritin (generic loratidine), Allegra (fexofenidine), or Zyrtec (generic cyrterizine) for runny nose. These medications should not cause drowsiness.  Note - Benadryl (generic diphenhydramine) may be used however may cause drowsiness  Cough -   Delsym or Robitussin (generic dextromethorphan)  Expectorants - helps loosen mucus to ease removal   Mucinex (generic guaifenesin) as directed on the package.  Headaches / General Aches   Tylenol (generic acetaminophen) - DO  NOT EXCEED 3 grams (3,000 mg) in a 24 hour time period  Advil/Motrin (generic ibuprofen)   Sore Throat -   Salt water gargle   Chloraseptic (generic benzocaine) spray or lozenges / Sucrets (generic dyclonine)    Sinusitis Sinusitis is redness, soreness, and inflammation of the paranasal sinuses. Paranasal sinuses are air pockets within the bones of your face (beneath the eyes, the middle of the forehead, or above the eyes). In healthy paranasal sinuses, mucus is able to drain out, and air is able to circulate through them by way of your nose. However, when your paranasal sinuses are inflamed, mucus and air can become trapped. This can allow bacteria and other germs to grow and cause infection. Sinusitis can develop quickly and last only a short time (acute) or continue over a long period (chronic). Sinusitis that lasts for more than 12 weeks is considered chronic.  CAUSES  Causes of sinusitis include:  Allergies.  Structural abnormalities, such as displacement of the cartilage that separates your nostrils (deviated septum), which can decrease the air flow through your nose and sinuses and affect sinus drainage.  Functional abnormalities, such as when the small hairs (cilia) that line your sinuses and help remove mucus do not work properly or are not present. SIGNS AND SYMPTOMS  Symptoms of acute and chronic sinusitis are the same. The primary symptoms are pain and pressure around the affected sinuses. Other symptoms include:  Upper toothache.  Earache.  Headache.  Bad breath.  Decreased sense of smell and taste.  A cough, which worsens when you are lying flat.  Fatigue.  Fever.  Thick drainage  from your nose, which often is green and may contain pus (purulent).  Swelling and warmth over the affected sinuses. DIAGNOSIS  Your health care provider will perform a physical exam. During the exam, your health care provider may:  Look in your nose for signs of abnormal growths  in your nostrils (nasal polyps).  Tap over the affected sinus to check for signs of infection.  View the inside of your sinuses (endoscopy) using an imaging device that has a light attached (endoscope). If your health care provider suspects that you have chronic sinusitis, one or more of the following tests may be recommended:  Allergy tests.  Nasal culture. A sample of mucus is taken from your nose, sent to a lab, and screened for bacteria.  Nasal cytology. A sample of mucus is taken from your nose and examined by your health care provider to determine if your sinusitis is related to an allergy. TREATMENT  Most cases of acute sinusitis are related to a viral infection and will resolve on their own within 10 days. Sometimes medicines are prescribed to help relieve symptoms (pain medicine, decongestants, nasal steroid sprays, or saline sprays).  However, for sinusitis related to a bacterial infection, your health care provider will prescribe antibiotic medicines. These are medicines that will help kill the bacteria causing the infection.  Rarely, sinusitis is caused by a fungal infection. In theses cases, your health care provider will prescribe antifungal medicine. For some cases of chronic sinusitis, surgery is needed. Generally, these are cases in which sinusitis recurs more than 3 times per year, despite other treatments. HOME CARE INSTRUCTIONS   Drink plenty of water. Water helps thin the mucus so your sinuses can drain more easily.  Use a humidifier.  Inhale steam 3 to 4 times a day (for example, sit in the bathroom with the shower running).  Apply a warm, moist washcloth to your face 3 to 4 times a day, or as directed by your health care provider.  Use saline nasal sprays to help moisten and clean your sinuses.  Take medicines only as directed by your health care provider.  If you were prescribed either an antibiotic or antifungal medicine, finish it all even if you start to feel  better. SEEK IMMEDIATE MEDICAL CARE IF:  You have increasing pain or severe headaches.  You have nausea, vomiting, or drowsiness.  You have swelling around your face.  You have vision problems.  You have a stiff neck.  You have difficulty breathing. MAKE SURE YOU:   Understand these instructions.  Will watch your condition.  Will get help right away if you are not doing well or get worse. Document Released: 12/08/2005 Document Revised: 04/24/2014 Document Reviewed: 12/23/2011 [facility] Patient Information 2015 [place], [place]. This information is not intended to replace advice given to you by your health care provider. Make sure you discuss any questions you have with your health care provider.   Hypertension Hypertension, commonly called high blood pressure, is when the force of blood pumping through your arteries is too strong. Your arteries are the blood vessels that carry blood from your heart throughout your body. A blood pressure reading consists of a higher number over a lower number, such as 110/72. The higher number (systolic) is the pressure inside your arteries when your heart pumps. The lower number (diastolic) is the pressure inside your arteries when your heart relaxes. Ideally you want your blood pressure below 120/80. Hypertension forces your heart to work harder to pump blood. Your arteries  may become narrow or stiff. Having untreated or uncontrolled hypertension can cause heart attack, stroke, kidney disease, and other problems. RISK FACTORS Some risk factors for high blood pressure are controllable. Others are not.  Risk factors you cannot control include:   Race. You may be at higher risk if you are African American.  Age. Risk increases with age.  Gender. Men are at higher risk than women before age 59 years. After age 54, women are at higher risk than men. Risk factors you can control include:  Not getting enough exercise or physical activity.  Being  overweight.  Getting too much fat, sugar, calories, or salt in your diet.  Drinking too much alcohol. SIGNS AND SYMPTOMS Hypertension does not usually cause signs or symptoms. Extremely high blood pressure (hypertensive crisis) may cause headache, anxiety, shortness of breath, and nosebleed. DIAGNOSIS To check if you have hypertension, your health care provider will measure your blood pressure while you are seated, with your arm held at the level of your heart. It should be measured at least twice using the same arm. Certain conditions can cause a difference in blood pressure between your right and left arms. A blood pressure reading that is higher than normal on one occasion does not mean that you need treatment. If it is not clear whether you have high blood pressure, you may be asked to return on a different day to have your blood pressure checked again. Or, you may be asked to monitor your blood pressure at home for 1 or more weeks. TREATMENT Treating high blood pressure includes making lifestyle changes and possibly taking medicine. Living a healthy lifestyle can help lower high blood pressure. You may need to change some of your habits. Lifestyle changes may include:  Following the DASH diet. This diet is high in fruits, vegetables, and whole grains. It is low in salt, red meat, and added sugars.  Keep your sodium intake below 2,300 mg per day.  Getting at least 30-45 minutes of aerobic exercise at least 4 times per week.  Losing weight if necessary.  Not smoking.  Limiting alcoholic beverages.  Learning ways to reduce stress. Your health care provider may prescribe medicine if lifestyle changes are not enough to get your blood pressure under control, and if one of the following is true:  You are 63-45 years of age and your systolic blood pressure is above 140.  You are 82 years of age or older, and your systolic blood pressure is above 150.  Your diastolic blood pressure is  above 90.  You have diabetes, and your systolic blood pressure is over 140 or your diastolic blood pressure is over 90.  You have kidney disease and your blood pressure is above 140/90.  You have heart disease and your blood pressure is above 140/90. Your personal target blood pressure may vary depending on your medical conditions, your age, and other factors. HOME CARE INSTRUCTIONS  Have your blood pressure rechecked as directed by your health care provider.   Take medicines only as directed by your health care provider. Follow the directions carefully. Blood pressure medicines must be taken as prescribed. The medicine does not work as well when you skip doses. Skipping doses also puts you at risk for problems.  Do not smoke.   Monitor your blood pressure at home as directed by your health care provider. SEEK MEDICAL CARE IF:   You think you are having a reaction to medicines taken.  You have recurrent headaches or  feel dizzy.  You have swelling in your ankles.  You have trouble with your vision. SEEK IMMEDIATE MEDICAL CARE IF:  You develop a severe headache or confusion.  You have unusual weakness, numbness, or feel faint.  You have severe chest or abdominal pain.  You vomit repeatedly.  You have trouble breathing. MAKE SURE YOU:   Understand these instructions.  Will watch your condition.  Will get help right away if you are not doing well or get worse.   This information is not intended to replace advice given to you by your health care provider. Make sure you discuss any questions you have with your health care provider.   Document Released: 12/08/2005 Document Revised: 04/24/2015 Document Reviewed: 09/30/2013 Elsevier Interactive Patient Education 2016 Elsevier Inc.  DASH Eating Plan DASH stands for "Dietary Approaches to Stop Hypertension." The DASH eating plan is a healthy eating plan that has been shown to reduce high blood pressure (hypertension).  Additional health benefits may include reducing the risk of type 2 diabetes mellitus, heart disease, and stroke. The DASH eating plan may also help with weight loss. WHAT DO I NEED TO KNOW ABOUT THE DASH EATING PLAN? For the DASH eating plan, you will follow these general guidelines:  Choose foods with a percent daily value for sodium of less than 5% (as listed on the food label).  Use salt-free seasonings or herbs instead of table salt or sea salt.  Check with your health care provider or pharmacist before using salt substitutes.  Eat lower-sodium products, often labeled as "lower sodium" or "no salt added."  Eat fresh foods.  Eat more vegetables, fruits, and low-fat dairy products.  Choose whole grains. Look for the word "whole" as the first word in the ingredient list.  Choose fish and skinless chicken or [place] more often than red meat. Limit fish, poultry, and meat to 6 oz (170 g) each day.  Limit sweets, desserts, sugars, and sugary drinks.  Choose heart-healthy fats.  Limit cheese to 1 oz (28 g) per day.  Eat more home-cooked food and less restaurant, buffet, and fast food.  Limit fried foods.  Cook foods using methods other than frying.  Limit canned vegetables. If you do use them, rinse them well to decrease the sodium.  When eating at a restaurant, ask that your food be prepared with less salt, or no salt if possible. WHAT FOODS CAN I EAT? Seek help from a dietitian for individual calorie needs. Grains Whole grain or whole wheat bread. Brown rice. Whole grain or whole wheat pasta. Quinoa, bulgur, and whole grain cereals. Low-sodium cereals. Corn or whole wheat flour tortillas. Whole grain cornbread. Whole grain crackers. Low-sodium crackers. Vegetables Fresh or frozen vegetables (raw, steamed, roasted, or grilled). Low-sodium or reduced-sodium tomato and vegetable juices. Low-sodium or reduced-sodium tomato sauce and paste. Low-sodium or reduced-sodium canned  vegetables.  Fruits All fresh, canned (in natural juice), or frozen fruits. Meat and Other Protein Products Ground beef (85% or leaner), grass-fed beef, or beef trimmed of fat. Skinless chicken or [place]. Ground chicken or [place]. Pork trimmed of fat. All fish and seafood. Eggs. Dried beans, peas, or lentils. Unsalted nuts and seeds. Unsalted canned beans. Dairy Low-fat dairy products, such as skim or 1% milk, 2% or reduced-fat cheeses, low-fat ricotta or cottage cheese, or plain low-fat yogurt. Low-sodium or reduced-sodium cheeses. Fats and Oils Tub margarines without trans fats. Light or reduced-fat mayonnaise and salad dressings (reduced sodium). Avocado. Safflower, olive, or canola oils. Natural peanut or  almond butter. Other Unsalted popcorn and pretzels. The items listed above may not be a complete list of recommended foods or beverages. Contact your dietitian for more options. WHAT FOODS ARE NOT RECOMMENDED? Grains White bread. White pasta. White rice. Refined cornbread. Bagels and croissants. Crackers that contain trans fat. Vegetables Creamed or fried vegetables. Vegetables in a cheese sauce. Regular canned vegetables. Regular canned tomato sauce and paste. Regular tomato and vegetable juices. Fruits Dried fruits. Canned fruit in light or heavy syrup. Fruit juice. Meat and Other Protein Products Fatty cuts of meat. Ribs, chicken wings, bacon, sausage, bologna, salami, chitterlings, fatback, hot dogs, bratwurst, and packaged luncheon meats. Salted nuts and seeds. Canned beans with salt. Dairy Whole or 2% milk, cream, half-and-half, and cream cheese. Whole-fat or sweetened yogurt. Full-fat cheeses or blue cheese. Nondairy creamers and whipped toppings. Processed cheese, cheese spreads, or cheese curds. Condiments Onion and garlic salt, seasoned salt, table salt, and sea salt. Canned and packaged gravies. Worcestershire sauce. Tartar sauce. Barbecue sauce. Teriyaki sauce. Soy sauce,  including reduced sodium. Steak sauce. Fish sauce. Oyster sauce. Cocktail sauce. Horseradish. Ketchup and mustard. Meat flavorings and tenderizers. Bouillon cubes. Hot sauce. Tabasco sauce. Marinades. Taco seasonings. Relishes. Fats and Oils Butter, stick margarine, lard, shortening, ghee, and bacon fat. Coconut, palm kernel, or palm oils. Regular salad dressings. Other Pickles and olives. Salted popcorn and pretzels. The items listed above may not be a complete list of foods and beverages to avoid. Contact your dietitian for more information. WHERE CAN I FIND MORE INFORMATION? National Heart, Lung, and Blood Institute: [employer].itwww.nhlbi.nih.gov/health/health-topics/topics/dash/   This information is not intended to replace advice given to you by your health care provider. Make sure you discuss any questions you have with your health care provider.   Document Released: 11/27/2011 Document Revised: 12/29/2014 Document Reviewed: 10/12/2013 Elsevier Interactive Patient Education [employer]2016 Elsevier Inc.

## 2016-10-28 NOTE — Assessment & Plan Note (Signed)
Multiple readings of high blood pressure and previously maintained on metoprolol. Denies worse headache of life with no symptoms of end organ damage noted upon physical exam. Restart metoprolol. Continue to monitor blood pressure at home as able and follow low-sodium diet. Information provided for Dash eating plan. Continue to monitor.

## 2016-10-28 NOTE — Assessment & Plan Note (Signed)
Symptoms and exam consistent with sinusitis of the given most recent improvements appears to be favoring viral infection. Continue over-the-counter medications as needed for symptom relief and supportive care. Written prescription for doxycycline provided with instructions for watchful waiting if symptoms do worsen in the next 3-5 days. Follow-up if symptoms worsen or fail to improve.

## 2016-11-17 ENCOUNTER — Ambulatory Visit: Payer: 59 | Admitting: Family

## 2016-12-01 ENCOUNTER — Ambulatory Visit (INDEPENDENT_AMBULATORY_CARE_PROVIDER_SITE_OTHER): Payer: 59 | Admitting: Family

## 2016-12-01 ENCOUNTER — Encounter: Payer: Self-pay | Admitting: Family

## 2016-12-01 DIAGNOSIS — K439 Ventral hernia without obstruction or gangrene: Secondary | ICD-10-CM | POA: Diagnosis not present

## 2016-12-01 DIAGNOSIS — I10 Essential (primary) hypertension: Secondary | ICD-10-CM

## 2016-12-01 MED ORDER — METOPROLOL SUCCINATE ER 25 MG PO TB24: 25.0000 mg | ORAL_TABLET | Freq: Every day | ORAL | 0 refills | Status: DC

## 2016-12-01 NOTE — Assessment & Plan Note (Signed)
Symptoms and exam consistent with ventral hernia with no gangrene or obstruction. Continue to monitor and follow up if symptoms worsen or becomes painful.

## 2016-12-01 NOTE — Patient Instructions (Signed)
Thank you for choosing [facility].  SUMMARY AND INSTRUCTIONS:  Medication:  Please continue to take your medication as prescribed.  Your prescription(s) have been submitted to your pharmacy or been printed and provided for you. Please take as directed and contact our office if you believe you are having problem(s) with the medication(s) or have any questions.  Follow up:  If your symptoms worsen or fail to improve, please contact our office for further instruction, or in case of emergency go directly to the emergency room at the closest medical facility.      

## 2016-12-01 NOTE — Progress Notes (Signed)
Subjective:    Patient ID: Julian Swanson, male    DOB: [birth year], 40 y.o.   MRN: [id number]  Chief Complaint  Patient presents with  . Establish Care    blood pressure    HPI:  Julian Swanson is a 40 y.o. male who  has a past medical history of Elevated blood pressure reading without diagnosis of hypertension and Hypertension. and presents today for a follow up office visit.   1.) Hypertension - Previously diagnosed with hypertension following several elevated blood pressure readings. Currently maintaned on metoprolol which he reports taking as prescribed and denies adverse side effects or hypotensive readings. Blood pressure readings at home appear adequately controlled with only occasional elevation. Has brought home BP cuff for correlation. Denies worst headache of life or symptom of end organ damge.   BP Readings from Last 3 Encounters:  12/01/16 (!) 150/100  10/28/16 (!) 168/110  10/10/15 (!) 140/95    2.) Mass of abdomen - Previously noted to have a small mass located in his upper abdomen that has been going on for several years and thought to be a possible lipoma or possible hernia with the recommendation to continue to monitor. Denies any trauma to the area. Mass may have increased in size but is non-painful. No attempted treatments or modifying factors that make it better or worse.    Allergies  Allergen Reactions  . Amoxicillin-Pot Clavulanate     Rash on thighs  . Penicillins     See Augmentin reaction  . Sulfa Antibiotics     05/19/14 urticaria  . Prednisone     No definitive reaction 05/19/14 known reaction denied      Outpatient Medications Prior to Visit  Medication Sig Dispense Refill  . metoprolol succinate (TOPROL-XL) 25 MG 24 hr tablet Take 1 tablet (25 mg total) by mouth daily. 30 tablet 1  . doxycycline (VIBRA-TABS) 100 MG tablet Take 1 tablet (100 mg total) by mouth 2 (two) times daily. 20 tablet 0   No facility-administered medications prior  to visit.       History reviewed. No pertinent surgical history.    Past Medical History:  Diagnosis Date  . Elevated blood pressure reading without diagnosis of hypertension    Whitecoat syndrome  . Hypertension       Review of Systems  Constitutional: Negative for chills and fever.  Eyes:       Negative for changes in vision  Respiratory: Negative for cough, chest tightness and wheezing.   Cardiovascular: Negative for chest pain, palpitations and leg swelling.  Gastrointestinal:       Positive for small abdominal mass.   Neurological: Negative for dizziness, weakness and light-headedness.      Objective:    BP (!) 150/100 (BP Location: Left Arm, Patient Position: Sitting, Cuff Size: Large)   Pulse 76   Temp 98 F (36.7 C) (Oral)   Resp 16   Ht 5\' 8"  (1.727 m)   Wt 213 lb 12.8 oz (97 kg)   SpO2 96%   BMI 32.51 kg/m  Nursing note and vital signs reviewed.  Physical Exam  Constitutional: He is oriented to person, place, and time. He appears well-developed and well-nourished. No distress.  Cardiovascular: Normal rate, regular rhythm, normal heart sounds and intact distal pulses.   Pulmonary/Chest: Effort normal and breath sounds normal.  Abdominal: Soft. Normal appearance and bowel sounds are normal. He exhibits no mass. There is no hepatosplenomegaly. There is no tenderness. There is no  rigidity, no rebound, no guarding, no tenderness at McBurney's point and negative Murphy's sign. A hernia is present. Hernia confirmed positive in the ventral area.  Neurological: He is alert and oriented to person, place, and time.  Skin: Skin is warm and dry.  Psychiatric: He has a normal mood and affect. His behavior is normal. Judgment and thought content normal.       Assessment & Plan:   Problem List Items Addressed This Visit      Cardiovascular and Mediastinum   Essential hypertension    Blood pressure remains elevated above goal 140/90 with current regimen, however  home blood pressure readings appears significantly lower. Blood pressure cuff correlation with manual  blood pressure reading today.Continue current dosage of metoprolol. Denies worst headache of life with no symptoms of end organ damage noted upon exam. Continue to monitor blood pressure at home. Follow low sodium diet.      Relevant Medications   metoprolol succinate (TOPROL-XL) 25 MG 24 hr tablet     Other   Ventral hernia    Symptoms and exam consistent with ventral hernia with no gangrene or obstruction. Continue to monitor and follow up if symptoms worsen or becomes painful.           I have discontinued Mr. Worthy FlankWinfree's doxycycline. I am also having him maintain his metoprolol succinate.   Meds ordered this encounter  Medications  . metoprolol succinate (TOPROL-XL) 25 MG 24 hr tablet    Sig: Take 1 tablet (25 mg total) by mouth daily.    Dispense:  90 tablet    Refill:  0    Please fill on patient request for refill.    Order Specific Question:   Supervising Provider    Answer:   Hillard DankerRAWFORD, ELIZABETH A [[id number]]     Follow-up: Return in about 3 months (around 03/01/2017), or if symptoms worsen or fail to improve.  Jeanine Luzalone, Rhiley Tarver, FNP

## 2016-12-01 NOTE — Assessment & Plan Note (Signed)
Blood pressure remains elevated above goal 140/90 with current regimen, however home blood pressure readings appears significantly lower. Blood pressure cuff correlation with manual  blood pressure reading today.Continue current dosage of metoprolol. Denies worst headache of life with no symptoms of end organ damage noted upon exam. Continue to monitor blood pressure at home. Follow low sodium diet.

## 2016-12-29 ENCOUNTER — Encounter: Payer: Self-pay | Admitting: Family

## 2016-12-29 ENCOUNTER — Ambulatory Visit (INDEPENDENT_AMBULATORY_CARE_PROVIDER_SITE_OTHER): Payer: 59 | Admitting: Family

## 2016-12-29 VITALS — BP 154/112 | HR 56 | Temp 98.0°F | Resp 16 | Ht 68.0 in | Wt 218.0 lb

## 2016-12-29 DIAGNOSIS — J0100 Acute maxillary sinusitis, unspecified: Secondary | ICD-10-CM

## 2016-12-29 DIAGNOSIS — I10 Essential (primary) hypertension: Secondary | ICD-10-CM

## 2016-12-29 MED ORDER — LEVOFLOXACIN 500 MG PO TABS: 500.0000 mg | ORAL_TABLET | Freq: Every day | ORAL | 0 refills | Status: DC

## 2016-12-29 NOTE — Assessment & Plan Note (Signed)
Symptoms and exam consistent with maxillary sinusitis most likely bacterial given time and no improvement in symptoms. Start levofloxacin. Continue over-the-counter medications as needed for symptom relief and supportive care. Follow-up if symptoms worsen or do not improve.

## 2016-12-29 NOTE — Progress Notes (Signed)
Subjective:    Patient ID: Julian Swanson, male    DOB: Mar 20, 1976, 41 y.o.   MRN: [id number]  Chief Complaint  Patient presents with  . sinus pressure    sinus pressure, cough, congestion, x3 weeks    HPI:  Julian Tarvis Blossom is a 41 y.o. male who  has a past medical history of Elevated blood pressure reading without diagnosis of hypertension and Hypertension. and presents today for an acute office visit.   This is a new problem. Associated symptoms of sinus pressure, cough, and congestion have been going on for 3 weeks. Denies fevers. Modifying factors include a nasal spray which seem to help with his symptoms. Cough is productive at times. Overall course of the symptoms has stayed about the same since the initial onset. Denies recent antibiotics.   Allergies  Allergen Reactions  . Amoxicillin-Pot Clavulanate     Rash on thighs  . Penicillins     See Augmentin reaction  . Sulfa Antibiotics     05/19/14 urticaria  . Prednisone     No definitive reaction 05/19/14 known reaction denied      Outpatient Medications Prior to Visit  Medication Sig Dispense Refill  . metoprolol succinate (TOPROL-XL) 25 MG 24 hr tablet Take 1 tablet (25 mg total) by mouth daily. 90 tablet 0   No facility-administered medications prior to visit.      Review of Systems  Constitutional: Negative for chills and fever.  HENT: Positive for congestion, ear pain, sinus pain and sinus pressure. Negative for sore throat.   Respiratory: Positive for cough. Negative for chest tightness and shortness of breath.   Neurological: Negative for headaches.      Objective:    BP (!) 154/112 (BP Location: Left Arm, Patient Position: Sitting, Cuff Size: Large)   Pulse (!) 56   Temp 98 F (36.7 C) (Oral)   Resp 16   Ht 5\' 8"  (1.727 m)   Wt 218 lb (98.9 kg)   SpO2 98%   BMI 33.15 kg/m  Nursing note and vital signs reviewed.  Physical Exam  Constitutional: He is oriented to person, place, and time. He  appears well-developed and well-nourished. No distress.  HENT:  Right Ear: Hearing, tympanic membrane, external ear and ear canal normal.  Left Ear: Hearing, tympanic membrane, external ear and ear canal normal.  Nose: Right sinus exhibits maxillary sinus tenderness and frontal sinus tenderness. Left sinus exhibits maxillary sinus tenderness and frontal sinus tenderness.  Mouth/Throat: Uvula is midline, oropharynx is clear and moist and mucous membranes are normal.  Neck: Neck supple.  Cardiovascular: Normal rate, regular rhythm, normal heart sounds and intact distal pulses.   Pulmonary/Chest: Effort normal and breath sounds normal.  Neurological: He is alert and oriented to person, place, and time.  Skin: Skin is warm and dry.  Psychiatric: He has a normal mood and affect. His behavior is normal. Judgment and thought content normal.       Assessment & Plan:   Problem List Items Addressed This Visit      Cardiovascular and Mediastinum   Essential hypertension    Blood pressure is elevated today with home readings below goal 140/90. Continue current dosage of metoprolol and continue to monitor blood pressure.        Respiratory   Sinusitis - Primary    Symptoms and exam consistent with maxillary sinusitis most likely bacterial given time and no improvement in symptoms. Start levofloxacin. Continue over-the-counter medications as needed for symptom relief  and supportive care. Follow-up if symptoms worsen or do not improve.      Relevant Medications   levofloxacin (LEVAQUIN) 500 MG tablet       I am having Mr. Tortorella start on levofloxacin. I am also having him maintain his metoprolol succinate.   Meds ordered this encounter  Medications  . levofloxacin (LEVAQUIN) 500 MG tablet    Sig: Take 1 tablet (500 mg total) by mouth daily.    Dispense:  7 tablet    Refill:  0    Order Specific Question:   Supervising Provider    Answer:   Hillard DankerRAWFORD, ELIZABETH A [[id number]]      Follow-up: Return in about 3 months (around 03/29/2017), or if symptoms worsen or fail to improve.  Jeanine Luzalone, Macio Kissoon, FNP

## 2016-12-29 NOTE — Assessment & Plan Note (Signed)
Blood pressure is elevated today with home readings below goal 140/90. Continue current dosage of metoprolol and continue to monitor blood pressure.

## 2016-12-29 NOTE — Patient Instructions (Addendum)
Thank you for choosing [facility].  SUMMARY AND INSTRUCTIONS:  Medication:  Your prescription(s) have been submitted to your pharmacy or been printed and provided for you. Please take as directed and contact our office if you believe you are having problem(s) with the medication(s) or have any questions.   Follow up:  If your symptoms worsen or fail to improve, please contact our office for further instruction, or in case of emergency go directly to the emergency room at the closest medical facility.    General Recommendations:    Please drink plenty of fluids.  Get plenty of rest   Sleep in humidified air  Use saline nasal sprays  Netti pot   OTC Medications:  Decongestants - helps relieve congestion   Flonase (generic fluticasone) or Nasacort (generic triamcinolone) - please make sure to use the "cross-over" technique at a 45 degree angle towards the opposite eye as opposed to straight up the nasal passageway.   Sudafed (generic pseudoephedrine - Note this is the one that is available behind the pharmacy counter); Products with phenylephrine (-PE) may also be used but is often not as effective as pseudoephedrine.   If you have HIGH BLOOD PRESSURE - Coricidin HBP; AVOID any product that is -D as this contains pseudoephedrine which may increase your blood pressure.  Afrin (oxymetazoline) every 6-8 hours for up to 3 days.   Allergies - helps relieve runny nose, itchy eyes and sneezing   Claritin (generic loratidine), Allegra (fexofenidine), or Zyrtec (generic cyrterizine) for runny nose. These medications should not cause drowsiness.  Note - Benadryl (generic diphenhydramine) may be used however may cause drowsiness  Cough -   Delsym or Robitussin (generic dextromethorphan)  Expectorants - helps loosen mucus to ease removal   Mucinex (generic guaifenesin) as directed on the package.  Headaches / General Aches   Tylenol (generic acetaminophen) - DO  NOT EXCEED 3 grams (3,000 mg) in a 24 hour time period  Advil/Motrin (generic ibuprofen)   Sore Throat -   Salt water gargle   Chloraseptic (generic benzocaine) spray or lozenges / Sucrets (generic dyclonine)    Sinusitis Sinusitis is redness, soreness, and inflammation of the paranasal sinuses. Paranasal sinuses are air pockets within the bones of your face (beneath the eyes, the middle of the forehead, or above the eyes). In healthy paranasal sinuses, mucus is able to drain out, and air is able to circulate through them by way of your nose. However, when your paranasal sinuses are inflamed, mucus and air can become trapped. This can allow bacteria and other germs to grow and cause infection. Sinusitis can develop quickly and last only a short time (acute) or continue over a long period (chronic). Sinusitis that lasts for more than 12 weeks is considered chronic.  CAUSES  Causes of sinusitis include:  Allergies.  Structural abnormalities, such as displacement of the cartilage that separates your nostrils (deviated septum), which can decrease the air flow through your nose and sinuses and affect sinus drainage.  Functional abnormalities, such as when the small hairs (cilia) that line your sinuses and help remove mucus do not work properly or are not present. SIGNS AND SYMPTOMS  Symptoms of acute and chronic sinusitis are the same. The primary symptoms are pain and pressure around the affected sinuses. Other symptoms include:  Upper toothache.  Earache.  Headache.  Bad breath.  Decreased sense of smell and taste.  A cough, which worsens when you are lying flat.  Fatigue.  Fever.  Thick drainage   from your nose, which often is green and may contain pus (purulent).  Swelling and warmth over the affected sinuses. DIAGNOSIS  Your health care provider will perform a physical exam. During the exam, your health care provider may:  Look in your nose for signs of abnormal growths  in your nostrils (nasal polyps).  Tap over the affected sinus to check for signs of infection.  View the inside of your sinuses (endoscopy) using an imaging device that has a light attached (endoscope). If your health care provider suspects that you have chronic sinusitis, one or more of the following tests may be recommended:  Allergy tests.  Nasal culture. A sample of mucus is taken from your nose, sent to a lab, and screened for bacteria.  Nasal cytology. A sample of mucus is taken from your nose and examined by your health care provider to determine if your sinusitis is related to an allergy. TREATMENT  Most cases of acute sinusitis are related to a viral infection and will resolve on their own within 10 days. Sometimes medicines are prescribed to help relieve symptoms (pain medicine, decongestants, nasal steroid sprays, or saline sprays).  However, for sinusitis related to a bacterial infection, your health care provider will prescribe antibiotic medicines. These are medicines that will help kill the bacteria causing the infection.  Rarely, sinusitis is caused by a fungal infection. In theses cases, your health care provider will prescribe antifungal medicine. For some cases of chronic sinusitis, surgery is needed. Generally, these are cases in which sinusitis recurs more than 3 times per year, despite other treatments. HOME CARE INSTRUCTIONS   Drink plenty of water. Water helps thin the mucus so your sinuses can drain more easily.  Use a humidifier.  Inhale steam 3 to 4 times a day (for example, sit in the bathroom with the shower running).  Apply a warm, moist washcloth to your face 3 to 4 times a day, or as directed by your health care provider.  Use saline nasal sprays to help moisten and clean your sinuses.  Take medicines only as directed by your health care provider.  If you were prescribed either an antibiotic or antifungal medicine, finish it all even if you start to feel  better. SEEK IMMEDIATE MEDICAL CARE IF:  You have increasing pain or severe headaches.  You have nausea, vomiting, or drowsiness.  You have swelling around your face.  You have vision problems.  You have a stiff neck.  You have difficulty breathing. MAKE SURE YOU:   Understand these instructions.  Will watch your condition.  Will get help right away if you are not doing well or get worse. Document Released: 12/08/2005 Document Revised: 04/24/2014 Document Reviewed: 12/23/2011 Exit[employer] Patient Information 2015 [employer]. This information is not intended to replace advice given to you by your health care provider. Make sure you discuss any questions you have with your health care provider.   

## 2017-03-13 ENCOUNTER — Ambulatory Visit (INDEPENDENT_AMBULATORY_CARE_PROVIDER_SITE_OTHER): Payer: 59 | Admitting: Family

## 2017-03-13 ENCOUNTER — Encounter: Payer: Self-pay | Admitting: Family

## 2017-03-13 DIAGNOSIS — J0100 Acute maxillary sinusitis, unspecified: Secondary | ICD-10-CM | POA: Diagnosis not present

## 2017-03-13 MED ORDER — LEVOFLOXACIN 500 MG PO TABS: 500.0000 mg | ORAL_TABLET | Freq: Every day | ORAL | 0 refills | Status: DC

## 2017-03-13 NOTE — Progress Notes (Signed)
Subjective:    Patient ID: Julian Swanson, male    DOB: 24-Aug-1976, 41 y.o.   MRN: [id number]  Chief Complaint  Patient presents with  . Cough    cough and congestion     HPI:  Julian Swanson is a 41 y.o. male who  has a past medical history of Elevated blood pressure reading without diagnosis of hypertension and Hypertension. and presents today for an acute office visit.  This is a new problem. Associated symptoms of cough, congestion and sinus pressure have been going on for about 1 week. Denies fevers. Started with sore throat and some drainage and progressed. Notes the symptoms are improved slightly today. Modifying factors include nose spray and some natural remedies. No recent antibiotics.   Allergies  Allergen Reactions  . Amoxicillin-Pot Clavulanate     Rash on thighs  . Penicillins     See Augmentin reaction  . Sulfa Antibiotics     05/19/14 urticaria  . Prednisone     No definitive reaction 05/19/14 known reaction denied      Outpatient Medications Prior to Visit  Medication Sig Dispense Refill  . metoprolol succinate (TOPROL-XL) 25 MG 24 hr tablet Take 1 tablet (25 mg total) by mouth daily. 90 tablet 0  . levofloxacin (LEVAQUIN) 500 MG tablet Take 1 tablet (500 mg total) by mouth daily. 7 tablet 0   No facility-administered medications prior to visit.     Review of Systems  Constitutional: Negative for chills and fever.  HENT: Positive for congestion, sinus pressure and sore throat.   Respiratory: Negative for chest tightness and shortness of breath.   Neurological: Positive for headaches.      Objective:    BP 136/80 (BP Location: Left Arm, Patient Position: Sitting, Cuff Size: Large)   Pulse 86   Temp 98.2 F (36.8 C) (Oral)   Resp 16   Ht 5\' 8"  (1.727 m)   Wt 214 lb (97.1 kg)   SpO2 97%   BMI 32.54 kg/m  Nursing note and vital signs reviewed.  Physical Exam  Constitutional: He is oriented to person, place, and time. He appears  well-developed and well-nourished. No distress.  HENT:  Right Ear: Hearing, tympanic membrane, external ear and ear canal normal.  Left Ear: Hearing, tympanic membrane, external ear and ear canal normal.  Nose: Right sinus exhibits maxillary sinus tenderness and frontal sinus tenderness. Left sinus exhibits maxillary sinus tenderness and frontal sinus tenderness.  Mouth/Throat: Uvula is midline, oropharynx is clear and moist and mucous membranes are normal.  Neck: Neck supple.  Cardiovascular: Normal rate, regular rhythm, normal heart sounds and intact distal pulses.   Pulmonary/Chest: Effort normal and breath sounds normal.  Neurological: He is alert and oriented to person, place, and time.  Skin: Skin is warm and dry.  Psychiatric: He has a normal mood and affect. His behavior is normal. Judgment and thought content normal.       Assessment & Plan:   Problem List Items Addressed This Visit      Respiratory   Sinusitis     Symptoms and exam consistent with sinusitis most likely viral given most recent improvements. Written prescription for levofloxacin provided instructions or watchful waiting. Continue over-the-counter medications as needed for symptom relief and supportive care. Follow-up if symptoms worsen or do notimprove      Relevant Medications   levofloxacin (LEVAQUIN) 500 MG tablet       I am having Mr. Kiper maintain his metoprolol succinate, vitamin  C, Cholecalciferol (D3 ADULT PO), Aluminum & Magnesium Hydroxide (MAGNESIUM-ALUMINUM PO), KRILL OIL PO, and levofloxacin.   Meds ordered this encounter  Medications  . Ascorbic Acid (VITAMIN C) 100 MG tablet    Sig: Take 100 mg by mouth daily.  . Cholecalciferol (D3 ADULT PO)    Sig: Take by mouth.  . Aluminum & Magnesium Hydroxide (MAGNESIUM-ALUMINUM PO)    Sig: Take by mouth.  Marland Kitchen. KRILL OIL PO    Sig: Take by mouth.  . levofloxacin (LEVAQUIN) 500 MG tablet    Sig: Take 1 tablet (500 mg total) by mouth daily.     Dispense:  7 tablet    Refill:  0    Order Specific Question:   Supervising Provider    Answer:   Hillard DankerRAWFORD, ELIZABETH A [[id number]]     Follow-up: Return if symptoms worsen or fail to improve.  Jeanine Luzalone, Ardenia Stiner, FNP

## 2017-03-13 NOTE — Patient Instructions (Addendum)
Thank you for choosing [facility].  SUMMARY AND INSTRUCTIONS:  Medication:  Your prescription(s) have been submitted to your pharmacy or been printed and provided for you. Please take as directed and contact our office if you believe you are having problem(s) with the medication(s) or have any questions.   Follow up:  If your symptoms worsen or fail to improve, please contact our office for further instruction, or in case of emergency go directly to the emergency room at the closest medical facility.    General Recommendations:    Please drink plenty of fluids.  Get plenty of rest   Sleep in humidified air  Use saline nasal sprays  Netti pot   OTC Medications:  Decongestants - helps relieve congestion   Flonase (generic fluticasone) or Nasacort (generic triamcinolone) - please make sure to use the "cross-over" technique at a 45 degree angle towards the opposite eye as opposed to straight up the nasal passageway.   Sudafed (generic pseudoephedrine - Note this is the one that is available behind the pharmacy counter); Products with phenylephrine (-PE) may also be used but is often not as effective as pseudoephedrine.   If you have HIGH BLOOD PRESSURE - Coricidin HBP; AVOID any product that is -D as this contains pseudoephedrine which may increase your blood pressure.  Afrin (oxymetazoline) every 6-8 hours for up to 3 days.   Allergies - helps relieve runny nose, itchy eyes and sneezing   Claritin (generic loratidine), Allegra (fexofenidine), or Zyrtec (generic cyrterizine) for runny nose. These medications should not cause drowsiness.  Note - Benadryl (generic diphenhydramine) may be used however may cause drowsiness  Cough -   Delsym or Robitussin (generic dextromethorphan)  Expectorants - helps loosen mucus to ease removal   Mucinex (generic guaifenesin) as directed on the package.  Headaches / General Aches   Tylenol (generic acetaminophen) - DO  NOT EXCEED 3 grams (3,000 mg) in a 24 hour time period  Advil/Motrin (generic ibuprofen)   Sore Throat -   Salt water gargle   Chloraseptic (generic benzocaine) spray or lozenges / Sucrets (generic dyclonine)    Sinusitis Sinusitis is redness, soreness, and inflammation of the paranasal sinuses. Paranasal sinuses are air pockets within the bones of your face (beneath the eyes, the middle of the forehead, or above the eyes). In healthy paranasal sinuses, mucus is able to drain out, and air is able to circulate through them by way of your nose. However, when your paranasal sinuses are inflamed, mucus and air can become trapped. This can allow bacteria and other germs to grow and cause infection. Sinusitis can develop quickly and last only a short time (acute) or continue over a long period (chronic). Sinusitis that lasts for more than 12 weeks is considered chronic.  CAUSES  Causes of sinusitis include:  Allergies.  Structural abnormalities, such as displacement of the cartilage that separates your nostrils (deviated septum), which can decrease the air flow through your nose and sinuses and affect sinus drainage.  Functional abnormalities, such as when the small hairs (cilia) that line your sinuses and help remove mucus do not work properly or are not present. SIGNS AND SYMPTOMS  Symptoms of acute and chronic sinusitis are the same. The primary symptoms are pain and pressure around the affected sinuses. Other symptoms include:  Upper toothache.  Earache.  Headache.  Bad breath.  Decreased sense of smell and taste.  A cough, which worsens when you are lying flat.  Fatigue.  Fever.  Thick drainage   from your nose, which often is green and may contain pus (purulent).  Swelling and warmth over the affected sinuses. DIAGNOSIS  Your health care provider will perform a physical exam. During the exam, your health care provider may:  Look in your nose for signs of abnormal growths  in your nostrils (nasal polyps).  Tap over the affected sinus to check for signs of infection.  View the inside of your sinuses (endoscopy) using an imaging device that has a light attached (endoscope). If your health care provider suspects that you have chronic sinusitis, one or more of the following tests may be recommended:  Allergy tests.  Nasal culture. A sample of mucus is taken from your nose, sent to a lab, and screened for bacteria.  Nasal cytology. A sample of mucus is taken from your nose and examined by your health care provider to determine if your sinusitis is related to an allergy. TREATMENT  Most cases of acute sinusitis are related to a viral infection and will resolve on their own within 10 days. Sometimes medicines are prescribed to help relieve symptoms (pain medicine, decongestants, nasal steroid sprays, or saline sprays).  However, for sinusitis related to a bacterial infection, your health care provider will prescribe antibiotic medicines. These are medicines that will help kill the bacteria causing the infection.  Rarely, sinusitis is caused by a fungal infection. In theses cases, your health care provider will prescribe antifungal medicine. For some cases of chronic sinusitis, surgery is needed. Generally, these are cases in which sinusitis recurs more than 3 times per year, despite other treatments. HOME CARE INSTRUCTIONS   Drink plenty of water. Water helps thin the mucus so your sinuses can drain more easily.  Use a humidifier.  Inhale steam 3 to 4 times a day (for example, sit in the bathroom with the shower running).  Apply a warm, moist washcloth to your face 3 to 4 times a day, or as directed by your health care provider.  Use saline nasal sprays to help moisten and clean your sinuses.  Take medicines only as directed by your health care provider.  If you were prescribed either an antibiotic or antifungal medicine, finish it all even if you start to feel  better. SEEK IMMEDIATE MEDICAL CARE IF:  You have increasing pain or severe headaches.  You have nausea, vomiting, or drowsiness.  You have swelling around your face.  You have vision problems.  You have a stiff neck.  You have difficulty breathing. MAKE SURE YOU:   Understand these instructions.  Will watch your condition.  Will get help right away if you are not doing well or get worse. Document Released: 12/08/2005 Document Revised: 04/24/2014 Document Reviewed: 12/23/2011 Exit[employer] Patient Information 2015 [employer]. This information is not intended to replace advice given to you by your health care provider. Make sure you discuss any questions you have with your health care provider.   

## 2017-03-13 NOTE — Assessment & Plan Note (Signed)
Symptoms and exam consistent with sinusitis most likely viral given most recent improvements. Written prescription for levofloxacin provided instructions or watchful waiting. Continue over-the-counter medications as needed for symptom relief and supportive care. Follow-up if symptoms worsen or do notimprove

## 2017-03-24 ENCOUNTER — Other Ambulatory Visit: Payer: Self-pay | Admitting: Family

## 2017-03-24 DIAGNOSIS — I10 Essential (primary) hypertension: Secondary | ICD-10-CM

## 2017-12-08 ENCOUNTER — Other Ambulatory Visit: Payer: Self-pay | Admitting: Family

## 2017-12-08 DIAGNOSIS — I10 Essential (primary) hypertension: Secondary | ICD-10-CM

## 2017-12-29 ENCOUNTER — Other Ambulatory Visit: Payer: Self-pay | Admitting: Family

## 2017-12-29 DIAGNOSIS — I10 Essential (primary) hypertension: Secondary | ICD-10-CM

## 2017-12-30 ENCOUNTER — Other Ambulatory Visit: Payer: Self-pay | Admitting: Internal Medicine

## 2017-12-30 DIAGNOSIS — I10 Essential (primary) hypertension: Secondary | ICD-10-CM

## 2018-01-05 ENCOUNTER — Other Ambulatory Visit (INDEPENDENT_AMBULATORY_CARE_PROVIDER_SITE_OTHER): Payer: 59

## 2018-01-05 ENCOUNTER — Ambulatory Visit: Payer: 59 | Admitting: Internal Medicine

## 2018-01-05 ENCOUNTER — Encounter: Payer: Self-pay | Admitting: Internal Medicine

## 2018-01-05 DIAGNOSIS — I10 Essential (primary) hypertension: Secondary | ICD-10-CM

## 2018-01-05 LAB — COMPREHENSIVE METABOLIC PANEL
ALT: 23 U/L (ref 0–53)
AST: 20 U/L (ref 0–37)
Albumin: 4.4 g/dL (ref 3.5–5.2)
Alkaline Phosphatase: 44 U/L (ref 39–117)
BILIRUBIN TOTAL: 1 mg/dL (ref 0.2–1.2)
BUN: 15 mg/dL (ref 6–23)
CALCIUM: 9.4 mg/dL (ref 8.4–10.5)
CO2: 28 meq/L (ref 19–32)
Chloride: 105 mEq/L (ref 96–112)
Creatinine, Ser: 1.12 mg/dL (ref 0.40–1.50)
GFR: 75.85 mL/min (ref >60.00)
Glucose, Bld: 99 mg/dL (ref 70–99)
POTASSIUM: 4 meq/L (ref 3.5–5.1)
Sodium: 140 mEq/L (ref 135–145)
Total Protein: 7 g/dL (ref 6.0–8.3)

## 2018-01-05 LAB — CBC
HEMATOCRIT: 46.2 % (ref 39.0–52.0)
HEMOGLOBIN: 15.8 g/dL (ref 13.0–17.0)
MCHC: 34.2 g/dL (ref 30.0–36.0)
MCV: 93.4 fl (ref 78.0–100.0)
PLATELETS: 177 10*3/uL (ref 150.0–400.0)
RBC: 4.95 Mil/uL (ref 4.22–5.81)
RDW: 13.1 % (ref 11.5–15.5)
WBC: 5.7 10*3/uL (ref 4.0–10.5)

## 2018-01-05 LAB — LIPID PANEL
CHOL/HDL RATIO: 4
Cholesterol: 168 mg/dL (ref 0–200)
HDL: 47.9 mg/dL (ref >39.00)
LDL Cholesterol: 97 mg/dL (ref 0–99)
NonHDL: 120.17
TRIGLYCERIDES: 115 mg/dL (ref 0.0–149.0)
VLDL: 23 mg/dL (ref 0.0–40.0)

## 2018-01-05 LAB — HEMOGLOBIN A1C: Hgb A1c MFr Bld: 5.3 % (ref 4.6–6.5)

## 2018-01-05 MED ORDER — HYDROCORTISONE 2.5 % RE CREA: 1.0000 "application " | TOPICAL_CREAM | Freq: Two times a day (BID) | RECTAL | 0 refills | Status: DC

## 2018-01-05 MED ORDER — METOPROLOL SUCCINATE ER 25 MG PO TB24: 25.0000 mg | ORAL_TABLET | Freq: Every day | ORAL | 0 refills | Status: DC

## 2018-01-05 NOTE — Assessment & Plan Note (Signed)
Refill metoprolol today, checking labs and adjust as needed. He is encouraged to work on weight reduction as he is up 20 pounds in the last 2 years.

## 2018-01-05 NOTE — Progress Notes (Signed)
   Subjective:    Patient ID: Julian Swanson, male    DOB: 1976/08/04, 42 y.o.   MRN: [id number]  HPI The patient is a 42 YO man coming in for follow up of his medical problems including his blood pressure (taking metoprolol, denies side effects, no headaches or chest pains or SOB). Denies new concerns. Does have white coat sometimes and BP does better at home than in the office. Was out for a week or so last week trying to get his medicines.   [facility], social history reviewed and updated.   Review of Systems  Constitutional: Negative.   HENT: Negative.   Eyes: Negative.   Respiratory: Negative for cough, chest tightness and shortness of breath.   Cardiovascular: Negative for chest pain, palpitations and leg swelling.  Gastrointestinal: Negative for abdominal distention, abdominal pain, constipation, diarrhea, nausea and vomiting.  Musculoskeletal: Negative.   Skin: Negative.   Neurological: Negative.   Psychiatric/Behavioral: Negative.       Objective:   Physical Exam  Constitutional: He is oriented to person, place, and time. He appears well-developed and well-nourished.  HENT:  Head: Normocephalic and atraumatic.  Eyes: EOM are normal.  Neck: Normal range of motion.  Cardiovascular: Normal rate and regular rhythm.  Pulmonary/Chest: Effort normal and breath sounds normal. No respiratory distress. He has no wheezes. He has no rales.  Abdominal: Soft. Bowel sounds are normal. He exhibits no distension. There is no tenderness. There is no rebound.  Musculoskeletal: He exhibits no edema.  Neurological: He is alert and oriented to person, place, and time. Coordination normal.  Skin: Skin is warm and dry.  Psychiatric: He has a normal mood and affect.   Vitals:   01/05/18 0833 01/05/18 0853  BP: (!) 150/94 140/90  Pulse: 83   Temp: 97.9 F (36.6 C)   TempSrc: Oral   SpO2: 99%   Weight: 220 lb (99.8 kg)   Height: 5\' 8"  (1.727 m)       Assessment & Plan:

## 2018-01-05 NOTE — Patient Instructions (Addendum)
We will check the labs today and have refilled the blood pressure medicine.   Work on adding exercise to help with the weight. As the weight increases the blood pressure generally goes up a little as well.  We have sent in the hemorrhoid medicine to use as needed.

## 2018-03-22 ENCOUNTER — Ambulatory Visit: Payer: 59 | Admitting: Nurse Practitioner

## 2018-03-22 ENCOUNTER — Encounter: Payer: Self-pay | Admitting: Nurse Practitioner

## 2018-03-22 VITALS — BP 150/100 | HR 57 | Temp 98.6°F | Resp 16 | Ht 68.0 in | Wt 215.0 lb

## 2018-03-22 DIAGNOSIS — I10 Essential (primary) hypertension: Secondary | ICD-10-CM | POA: Diagnosis not present

## 2018-03-22 NOTE — Patient Instructions (Addendum)
Please try to check your blood pressure once daily or at least a few times a week, at the same time each day, and keep a log. Please let me know if your blood pressures are reading over 140/90 at home  Please return in about 3 months- bring your log and home monitor and we will make sure your blood pressure remains stable.  It was good to meet you. Thanks for letting me take care of you today :)   Mediterranean Diet A Mediterranean diet refers to food and lifestyle choices that are based on the traditions of countries located on the [employer]. This way of eating has been shown to help prevent certain conditions and improve outcomes for people who have chronic diseases, like kidney disease and heart disease. What are tips for following this plan? Lifestyle  Cook and eat meals together with your family, when possible.  Drink enough fluid to keep your urine clear or pale yellow.  Be physically active every day. This includes: ? Aerobic exercise like running or swimming. ? Leisure activities like gardening, walking, or housework.  Get 7-8 hours of sleep each night.  If recommended by your health care provider, drink red wine in moderation. This means 1 glass a day for nonpregnant women and 2 glasses a day for men. A glass of wine equals 5 oz (150 mL). Reading food labels  Check the serving size of packaged foods. For foods such as rice and pasta, the serving size refers to the amount of cooked product, not dry.  Check the total fat in packaged foods. Avoid foods that have saturated fat or trans fats.  Check the ingredients list for added sugars, such as corn syrup. Shopping  At the grocery store, buy most of your food from the areas near the walls of the store. This includes: ? Fresh fruits and vegetables (produce). ? Grains, beans, nuts, and seeds. Some of these may be available in unpackaged forms or large amounts (in bulk). ? Fresh seafood. ? Poultry and eggs. ? Low-fat  dairy products.  Buy whole ingredients instead of prepackaged foods.  Buy fresh fruits and vegetables in-season from local farmers markets.  Buy frozen fruits and vegetables in resealable bags.  If you do not have access to quality fresh seafood, buy precooked frozen shrimp or canned fish, such as tuna, salmon, or sardines.  Buy small amounts of raw or cooked vegetables, salads, or olives from the deli or salad bar at your store.  Stock your pantry so you always have certain foods on hand, such as olive oil, canned tuna, canned tomatoes, rice, pasta, and beans. Cooking  Cook foods with extra-virgin olive oil instead of using butter or other vegetable oils.  Have meat as a side dish, and have vegetables or grains as your main dish. This means having meat in small portions or adding small amounts of meat to foods like pasta or stew.  Use beans or vegetables instead of meat in common dishes like chili or lasagna.  Experiment with different cooking methods. Try roasting or broiling vegetables instead of steaming or sauteing them.  Add frozen vegetables to soups, stews, pasta, or rice.  Add nuts or seeds for added healthy fat at each meal. You can add these to yogurt, salads, or vegetable dishes.  Marinate fish or vegetables using olive oil, lemon juice, garlic, and fresh herbs. Meal planning  Plan to eat 1 vegetarian meal one day each week. Try to work up to 2 vegetarian meals,  if possible.  Eat seafood 2 or more times a week.  Have healthy snacks readily available, such as: ? Vegetable sticks with hummus. ? [place] yogurt. ? Fruit and nut trail mix.  Eat balanced meals throughout the week. This includes: ? Fruit: 2-3 servings a day ? Vegetables: 4-5 servings a day ? Low-fat dairy: 2 servings a day ? Fish, poultry, or lean meat: 1 serving a day ? Beans and legumes: 2 or more servings a week ? Nuts and seeds: 1-2 servings a day ? Whole grains: 6-8 servings a  day ? Extra-virgin olive oil: 3-4 servings a day  Limit red meat and sweets to only a few servings a month What are my food choices?  Mediterranean diet ? Recommended ? Grains: Whole-grain pasta. Brown rice. Bulgar wheat. Polenta. Couscous. Whole-wheat bread. Orpah Cobbatmeal. Quinoa. ? Vegetables: Artichokes. Beets. Broccoli. Cabbage. Carrots. Eggplant. Green beans. Chard. Kale. Spinach. Onions. Leeks. Peas. Squash. Tomatoes. Peppers. Radishes. ? Fruits: Apples. Apricots. Avocado. Berries. Bananas. Cherries. Dates. Figs. Grapes. Lemons. Melon. Oranges. Peaches. Plums. Pomegranate. ? Meats and other protein foods: Beans. Almonds. Sunflower seeds. Pine nuts. Peanuts. Cod. Salmon. Scallops. Shrimp. Tuna. Tilapia. Clams. Oysters. Eggs. ? Dairy: Low-fat milk. Cheese. Greek yogurt. ? Beverages: Water. Red wine. Herbal tea. ? Fats and oils: Extra virgin olive oil. Avocado oil. Grape seed oil. ? Sweets and desserts: [place] yogurt with honey. Baked apples. Poached pears. Trail mix. ? Seasoning and other foods: Basil. Cilantro. Coriander. Cumin. Mint. Parsley. Sage. Rosemary. Tarragon. Garlic. Oregano. Thyme. Pepper. Balsalmic vinegar. Tahini. Hummus. Tomato sauce. Olives. Mushrooms. ? Limit these ? Grains: Prepackaged pasta or rice dishes. Prepackaged cereal with added sugar. ? Vegetables: Deep fried potatoes (french fries). ? Fruits: Fruit canned in syrup. ? Meats and other protein foods: Beef. Pork. Lamb. Poultry with skin. Hot dogs. Tomasa BlaseBacon. ? Dairy: Ice cream. Sour cream. Whole milk. ? Beverages: Juice. Sugar-sweetened soft drinks. Beer. Liquor and spirits. ? Fats and oils: Butter. Canola oil. Vegetable oil. Beef fat (tallow). Lard. ? Sweets and desserts: Cookies. Cakes. Pies. Candy. ? Seasoning and other foods: Mayonnaise. Premade sauces and marinades. ? The items listed may not be a complete list. Talk with your dietitian about what dietary choices are right for you. Summary  The Mediterranean diet  includes both food and lifestyle choices.  Eat a variety of fresh fruits and vegetables, beans, nuts, seeds, and whole grains.  Limit the amount of red meat and sweets that you eat.  Talk with your health care provider about whether it is safe for you to drink red wine in moderation. This means 1 glass a day for nonpregnant women and 2 glasses a day for men. A glass of wine equals 5 oz (150 mL). This information is not intended to replace advice given to you by your health care provider. Make sure you discuss any questions you have with your health care provider. Document Released: 07/31/2016 Document Revised: 09/02/2016 Document Reviewed: 07/31/2016 Elsevier Interactive Patient Education  [employer]2018 Elsevier Inc.

## 2018-03-22 NOTE — Progress Notes (Signed)
Name: Julian Swanson   MRN: [id number]    DOB: 1976/10/12   Date:03/22/2018       Progress Note  Subjective  Chief Complaint  Chief Complaint  Patient presents with  . Establish Care    HPI Julian Swanson is establishing care with me today, transferring from another provider who has left our clinic. He has no complaints. We will review his chronic medical conditions today which include HTN.  Hypertension -maintained on metoprolol succinate 25 mg daily. Reports daily medication compliance without noted adverse medication effects. Reports readings are typically around 120/60s at home when he checks occasionally. He has noticed that his blood pressure tends to go up in medical settings and admits he was thinking about his appointment all day today and worried about his blood pressure reading. Denies vision changes, chest pain, shortness of breath, edema.  BP Readings from Last 3 Encounters:  03/22/18 (!) 152/100  01/05/18 140/90  03/13/17 136/80    Patient Active Problem List   Diagnosis Date Noted  . Ventral hernia 12/01/2016  . Essential hypertension 10/28/2016  . Sinusitis 12/11/2010  . CANDIDIASIS, SKIN 10/28/2010  . ELEVATED BLOOD PRESSURE WITHOUT DIAGNOSIS OF HYPERTENSION 03/27/2008  . GILBERT'S SYNDROME 10/20/2007    No past surgical history on file.  Family History  Problem Relation Age of Onset  . Diabetes Unknown        relation not spec.  Marland Kitchen Hypertension Unknown        relation not spec.  Marland Kitchen Hypertension Mother   . Hypertension Father     Social History   Socioeconomic History  . Marital status: Married    Spouse name: Not on file  . Number of children: 2  . Years of education: 66  . Highest education level: Not on file  Occupational History  . Occupation: Copywriter, advertising  Social Needs  . Financial resource strain: Not on file  . Food insecurity:    Worry: Not on file    Inability: Not on file  . Transportation needs:    Medical: Not on file    Non-medical:  Not on file  Tobacco Use  . Smoking status: Never Smoker  . Smokeless tobacco: Never Used  Substance and Sexual Activity  . Alcohol use: No  . Drug use: No  . Sexual activity: Not on file  Lifestyle  . Physical activity:    Days per week: Not on file    Minutes per session: Not on file  . Stress: Not on file  Relationships  . Social connections:    Talks on phone: Not on file    Gets together: Not on file    Attends religious service: Not on file    Active member of club or organization: Not on file    Attends meetings of clubs or organizations: Not on file    Relationship status: Not on file  . Intimate partner violence:    Fear of current or ex partner: Not on file    Emotionally abused: Not on file    Physically abused: Not on file    Forced sexual activity: Not on file  Other Topics Concern  . Not on file  Social History Narrative   Fun: Basketball, yard work, Youth Group      Current Outpatient Medications:  .  Aluminum & Magnesium Hydroxide (MAGNESIUM-ALUMINUM PO), Take by mouth., Disp: , Rfl:  .  Ascorbic Acid (VITAMIN C) 100 MG tablet, Take 100 mg by mouth daily., Disp: , Rfl:  .  Cholecalciferol (D3 ADULT PO), Take by mouth., Disp: , Rfl:  .  hydrocortisone (ANUSOL-HC) 2.5 % rectal cream, Place 1 application rectally 2 (two) times daily., Disp: 30 g, Rfl: 0 .  KRILL OIL PO, Take by mouth., Disp: , Rfl:  .  metoprolol succinate (TOPROL-XL) 25 MG 24 hr tablet, Take 1 tablet (25 mg total) by mouth daily., Disp: 90 tablet, Rfl: 0  Allergies  Allergen Reactions  . Amoxicillin-Pot Clavulanate     Rash on thighs  . Penicillins     See Augmentin reaction  . Sulfa Antibiotics     05/19/14 urticaria  . Prednisone     No definitive reaction 05/19/14 known reaction denied     ROS See HPI  Objective  Vitals:   03/22/18 1309  BP: (!) 152/100  Pulse: (!) 57  Resp: 16  Temp: 98.6 F (37 C)  TempSrc: Oral  SpO2: 96%  Weight: 215 lb (97.5 kg)  Height: 5\' 8"   (1.727 m)    Body mass index is 32.69 kg/m.  Physical Exam Vital signs reviewed. Constitutional: Patient appears well-developed and well-nourished. No distress.  HENT: Head: Normocephalic and atraumatic. Nose: Nose normal. Mouth/Throat: Oropharynx is clear and moist. Eyes: Conjunctivae and EOM are normal. Pupils are equal, round, and reactive to light. No scleral icterus.  Neck: Normal range of motion. Neck supple.  Cardiovascular: Normal rate, regular rhythm. No BLE edema. Distal pulses intact. Pulmonary/Chest: Effort normal and breath sounds normal. No respiratory distress. Neurological: He is alert and oriented to person, place, and time. Coordination, balance, strength, speech and gait are normal.  Skin: Skin is warm and dry. No rash noted. No erythema.  Psychiatric: Patient has a normal mood and affect. behavior is normal. Judgment and thought content normal.   PHQ2/9: Depression screen PHQ 2/9 01/05/2018  Decreased Interest 0  Down, Depressed, Hopeless 0  PHQ - 2 Score 0   Assessment & Plan RTC in 3 months for F/U: HTN

## 2018-03-22 NOTE — Assessment & Plan Note (Signed)
BP elevated on two readings in clinic today with normal readings reported at home, noted to have high BP in clinical setting in the past We discussed adjusting BP medications vs monitoring at home and he will monitor home readings and return for F/U in 3 months, or sooner for readings >140/90 at home Discussed the role of healthy diet and exercise in the management of HTN-See AVS for additional information provided to patient-he admits he has gained weight recently and is working on weight loss CBC, CMET, Lipid, A1c WNL 01/05/18

## 2018-04-28 ENCOUNTER — Other Ambulatory Visit: Payer: Self-pay | Admitting: Internal Medicine

## 2018-04-28 DIAGNOSIS — I10 Essential (primary) hypertension: Secondary | ICD-10-CM

## 2018-10-15 ENCOUNTER — Other Ambulatory Visit: Payer: Self-pay | Admitting: Nurse Practitioner

## 2018-10-15 DIAGNOSIS — I10 Essential (primary) hypertension: Secondary | ICD-10-CM

## 2018-12-16 ENCOUNTER — Telehealth: Payer: Self-pay | Admitting: Internal Medicine

## 2018-12-16 ENCOUNTER — Ambulatory Visit: Payer: 59 | Admitting: Internal Medicine

## 2018-12-16 MED ORDER — HYDROCORTISONE 2.5 % RE CREA: 1.0000 "application " | TOPICAL_CREAM | Freq: Two times a day (BID) | RECTAL | 0 refills | Status: DC

## 2018-12-16 NOTE — Telephone Encounter (Signed)
I have informed patient and have scheduled follow up.

## 2018-12-16 NOTE — Telephone Encounter (Signed)
Ref[facility]

## 2018-12-16 NOTE — Telephone Encounter (Signed)
Patient states he has seen Dr. Okey Duprerawford for rectal itch.  States she had prescribed a cream that he has just ran out of.  Patient states that the itch is back.  Patient would like refill of medication prescribed.  Does patient still need to keep his appt this evening or can patient get refill and [facility]?

## 2019-01-10 ENCOUNTER — Encounter: Payer: Self-pay | Admitting: Nurse Practitioner

## 2019-01-10 ENCOUNTER — Ambulatory Visit (INDEPENDENT_AMBULATORY_CARE_PROVIDER_SITE_OTHER): Payer: 59 | Admitting: Nurse Practitioner

## 2019-01-10 ENCOUNTER — Other Ambulatory Visit (INDEPENDENT_AMBULATORY_CARE_PROVIDER_SITE_OTHER): Payer: 59

## 2019-01-10 VITALS — BP 160/90 | HR 83 | Ht 68.0 in | Wt 219.0 lb

## 2019-01-10 DIAGNOSIS — I10 Essential (primary) hypertension: Secondary | ICD-10-CM

## 2019-01-10 DIAGNOSIS — Z125 Encounter for screening for malignant neoplasm of prostate: Secondary | ICD-10-CM

## 2019-01-10 DIAGNOSIS — Z23 Encounter for immunization: Secondary | ICD-10-CM | POA: Diagnosis not present

## 2019-01-10 DIAGNOSIS — Z0001 Encounter for general adult medical examination with abnormal findings: Secondary | ICD-10-CM

## 2019-01-10 DIAGNOSIS — Z13220 Encounter for screening for lipoid disorders: Secondary | ICD-10-CM | POA: Diagnosis not present

## 2019-01-10 LAB — CBC
HCT: 48.6 % (ref 39.0–52.0)
Hemoglobin: 16.6 g/dL (ref 13.0–17.0)
MCHC: 34.1 g/dL (ref 30.0–36.0)
MCV: 94.1 fl (ref 78.0–100.0)
Platelets: 184 K/uL (ref 150.0–400.0)
RBC: 5.16 Mil/uL (ref 4.22–5.81)
RDW: 12.8 % (ref 11.5–15.5)
WBC: 6.1 K/uL (ref 4.0–10.5)

## 2019-01-10 LAB — COMPREHENSIVE METABOLIC PANEL
ALT: 21 U/L (ref 0–53)
AST: 19 U/L (ref 0–37)
Albumin: 4.6 g/dL (ref 3.5–5.2)
Alkaline Phosphatase: 52 U/L (ref 39–117)
BUN: 15 mg/dL (ref 6–23)
CO2: 29 mEq/L (ref 19–32)
Calcium: 9.9 mg/dL (ref 8.4–10.5)
Chloride: 104 mEq/L (ref 96–112)
Creatinine, Ser: 1.2 mg/dL (ref 0.40–1.50)
GFR: 65.6 mL/min (ref >60.00)
Glucose, Bld: 86 mg/dL (ref 70–99)
Potassium: 4.4 mEq/L (ref 3.5–5.1)
Sodium: 141 mEq/L (ref 135–145)
Total Bilirubin: 1 mg/dL (ref 0.2–1.2)
Total Protein: 7 g/dL (ref 6.0–8.3)

## 2019-01-10 LAB — LIPID PANEL
Cholesterol: 196 mg/dL (ref 0–200)
HDL: 54.1 mg/dL (ref >39.00)
LDL Cholesterol: 127 mg/dL — ABNORMAL HIGH (ref 0–99)
NonHDL: 141.98
Total CHOL/HDL Ratio: 4
Triglycerides: 76 mg/dL (ref 0.0–149.0)
VLDL: 15.2 mg/dL (ref 0.0–40.0)

## 2019-01-10 LAB — TSH: TSH: 1.47 u[IU]/mL (ref 0.35–4.50)

## 2019-01-10 LAB — PSA: PSA: 0.53 ng/mL (ref 0.10–4.00)

## 2019-01-10 NOTE — Assessment & Plan Note (Signed)
BP again elevated in the clinic today with reported normal readings at home He will continue to monitor home BP readings, perhaps his cuff is old he will consider buying new one Discussed the role of healthy diet and exercise in the management of HTN and printed additional information on AVS He will RTC in 6 months for follow up, sooner for readings >140/90 at home - CBC; Future - Comprehensive metabolic panel; Future - TSH; Future

## 2019-01-10 NOTE — Progress Notes (Signed)
Julian Swanson is a 43 y.o. male with the following history as recorded in Epic[employer]:  Patient Active Problem List   Diagnosis Date Noted  . Ventral hernia 12/01/2016  . Essential hypertension 10/28/2016  . GILBERT'S SYNDROME 10/20/2007    Current Outpatient Medications  Medication Sig Dispense Refill  . Aluminum & Magnesium Hydroxide (MAGNESIUM-ALUMINUM PO) Take by mouth.    . Ascorbic Acid (VITAMIN C) 100 MG tablet Take 100 mg by mouth daily.    . Cholecalciferol (D3 ADULT PO) Take by mouth.    . hydrocortisone (ANUSOL-HC) 2.5 % rectal cream Place 1 application rectally 2 (two) times daily. 30 g 0  . KRILL OIL PO Take by mouth.    . metoprolol succinate (TOPROL-XL) 25 MG 24 hr tablet TAKE 1 TABLET BY MOUTH EVERY DAY 90 tablet 0   No current facility-administered medications for this visit.     Allergies: Amoxicillin-pot clavulanate; Penicillins; Sulfa antibiotics; and Prednisone  Past Medical History:  Diagnosis Date  . Elevated blood pressure reading without diagnosis of hypertension    Whitecoat syndrome  . Hypertension     History reviewed. No pertinent surgical history.  Family History  Problem Relation Age of Onset  . Diabetes Unknown        relation not spec.  Marland Kitchen Hypertension Unknown        relation not spec.  Marland Kitchen Hypertension Mother   . Hypertension Father     Social History   Tobacco Use  . Smoking status: Never Smoker  . Smokeless tobacco: Never Used  Substance Use Topics  . Alcohol use: No     Subjective:  Julian Swanson is here today for CPE-  Last dental exam: 2019 Last vision exam: no routine vision exam PSA: PSA ordered Lipids: lipid panel ordered DM screening- not indicated Vaccinations: declines flu vaccine; tdap given today Diet and exercise: has lost a few lbs, drinking more water, avoiding other sugary drinks, wants to work on losing more weight   Hypertension -maintained on metoprolol 25 daily, Reports daily medication compliance without noted  adverse medication effects. I last saw him on 03/22/18 for OV, he was asked to return in 3 months for follow up of elevated blood pressure but he did not return until today, he tells me that he has continued to check readings about once a week at home with most readings 120s/80s, no readings > 140s/90s. Again today he says he feels his BP increases in medical setting. He has home monitor here with him today which is actually reading slightly higher than our office readings.  BP Readings from Last 3 Encounters:  01/10/19 (!) 160/90  03/22/18 (!) 150/100  01/05/18 140/90    Review of Systems  Constitutional: Negative for chills, fever and weight loss.  HENT: Negative for hearing loss.   Eyes: Negative for blurred vision and double vision.  Respiratory: Negative for cough and shortness of breath.   Cardiovascular: Negative for chest pain and palpitations.  Gastrointestinal: Negative for abdominal pain, constipation, diarrhea, nausea and vomiting.  Genitourinary: Negative for dysuria and hematuria.  Musculoskeletal: Negative for falls.  Skin: Negative for rash.  Neurological: Negative for dizziness, speech change, loss of consciousness and weakness.  Endo/Heme/Allergies: Negative for environmental allergies. Does not bruise/bleed easily.  Psychiatric/Behavioral: Negative for depression. The patient is not nervous/anxious.    Objective:  Vitals:   01/10/19 1301 01/10/19 1331  BP: (!) 178/100 (!) 160/90  Pulse: 83   SpO2: 97%   Weight: 219 lb (99.3  kg)   Height: 5\' 8"  (1.727 m)   Body mass index is 33.3 kg/m.  General: Well developed, well nourished, in no acute distress  Skin : Warm and dry.  Head: Normocephalic and atraumatic  Eyes: Sclera and conjunctiva clear; pupils round and reactive to light; extraocular movements intact  Ears: External normal; canals clear; tympanic membranes normal  Oropharynx: Pink, supple. No suspicious lesions  Neck: Supple without thyromegaly, adenopathy   Lungs: Respirations unlabored; clear to auscultation bilaterally without wheeze, rales, rhonchi  CVS exam: normal rate and regular rhythm, S1 and S2 normal.  Abdomen: Soft; nontender; nondistended; normoactive bowel sounds; no masses or hepatosplenomegaly  Musculoskeletal: No deformities; no active joint inflammation  Extremities: No edema, cyanosis, clubbing  Vessels: Symmetric bilaterally  Neurologic: Alert and oriented; speech intact; face symmetrical; moves all extremities well; CNII-XII intact without focal deficit  Psychiatric: Normal mood and affect.  Assessment:  1. Encounter for general adult medical examination with abnormal findings   2. Essential hypertension   3. Screening for prostate cancer   4. Screening for cholesterol level   5. Need for Tdap vaccination     Plan:    Return in about 6 months (around 07/11/2019) for F/U:: BP- recheck BP and home logs.  Orders Placed This Encounter  Procedures  . Tdap vaccine greater than or equal to 7yo IM  . CBC    Standing Status:   Future    Number of Occurrences:   1    Standing Expiration Date:   01/11/2020  . Comprehensive metabolic panel    Standing Status:   Future    Number of Occurrences:   1    Standing Expiration Date:   01/11/2020  . Lipid panel    Standing Status:   Future    Number of Occurrences:   1    Standing Expiration Date:   01/11/2020  . PSA    Standing Status:   Future    Number of Occurrences:   1    Standing Expiration Date:   01/11/2020  . TSH    Standing Status:   Future    Number of Occurrences:   1    Standing Expiration Date:   01/11/2020    Requested Prescriptions    No prescriptions requested or ordered in this encounter

## 2019-01-10 NOTE — Assessment & Plan Note (Signed)
Reviewed annual screening exams, healthy lifestyle, weight loss,  additional information provided on AVS - CBC; Future - Comprehensive metabolic panel; Future - Lipid panel; Future - PSA; Future - TSH; Future  Screening for prostate cancer- PSA; Future  Screening for cholesterol level He is fasting - Lipid panel; Future  Need for Tdap vaccination- Tdap vaccine greater than or equal to 45yo IM

## 2019-01-10 NOTE — Patient Instructions (Signed)
Head downstairs for labs  I will see you back in 6 months for blood pressure follow up, or sooner for readings over 140/90 at home   Mediterranean Diet A Mediterranean diet refers to food and lifestyle choices that are based on the traditions of countries located on the [employer]. This way of eating has been shown to help prevent certain conditions and improve outcomes for people who have chronic diseases, like kidney disease and heart disease. What are tips for following this plan? Lifestyle  Cook and eat meals together with your family, when possible.  Drink enough fluid to keep your urine clear or pale yellow.  Be physically active every day. This includes: ? Aerobic exercise like running or swimming. ? Leisure activities like gardening, walking, or housework.  Get 7-8 hours of sleep each night.  If recommended by your health care provider, drink red wine in moderation. This means 1 glass a day for nonpregnant women and 2 glasses a day for men. A glass of wine equals 5 oz (150 mL). Reading food labels   Check the serving size of packaged foods. For foods such as rice and pasta, the serving size refers to the amount of cooked product, not dry.  Check the total fat in packaged foods. Avoid foods that have saturated fat or trans fats.  Check the ingredients list for added sugars, such as corn syrup. Shopping  At the grocery store, buy most of your food from the areas near the walls of the store. This includes: ? Fresh fruits and vegetables (produce). ? Grains, beans, nuts, and seeds. Some of these may be available in unpackaged forms or large amounts (in bulk). ? Fresh seafood. ? Poultry and eggs. ? Low-fat dairy products.  Buy whole ingredients instead of prepackaged foods.  Buy fresh fruits and vegetables in-season from local farmers markets.  Buy frozen fruits and vegetables in resealable bags.  If you do not have access to quality fresh seafood, buy precooked  frozen shrimp or canned fish, such as tuna, salmon, or sardines.  Buy small amounts of raw or cooked vegetables, salads, or olives from the deli or salad bar at your store.  Stock your pantry so you always have certain foods on hand, such as olive oil, canned tuna, canned tomatoes, rice, pasta, and beans. Cooking  Cook foods with extra-virgin olive oil instead of using butter or other vegetable oils.  Have meat as a side dish, and have vegetables or grains as your main dish. This means having meat in small portions or adding small amounts of meat to foods like pasta or stew.  Use beans or vegetables instead of meat in common dishes like chili or lasagna.  Experiment with different cooking methods. Try roasting or broiling vegetables instead of steaming or sauteing them.  Add frozen vegetables to soups, stews, pasta, or rice.  Add nuts or seeds for added healthy fat at each meal. You can add these to yogurt, salads, or vegetable dishes.  Marinate fish or vegetables using olive oil, lemon juice, garlic, and fresh herbs. Meal planning   Plan to eat 1 vegetarian meal one day each week. Try to work up to 2 vegetarian meals, if possible.  Eat seafood 2 or more times a week.  Have healthy snacks readily available, such as: ? Vegetable sticks with hummus. ? [place] yogurt. ? Fruit and nut trail mix.  Eat balanced meals throughout the week. This includes: ? Fruit: 2-3 servings a day ? Vegetables: 4-5 servings a  day ? Low-fat dairy: 2 servings a day ? Fish, poultry, or lean meat: 1 serving a day ? Beans and legumes: 2 or more servings a week ? Nuts and seeds: 1-2 servings a day ? Whole grains: 6-8 servings a day ? Extra-virgin olive oil: 3-4 servings a day  Limit red meat and sweets to only a few servings a month What are my food choices?  Mediterranean diet ? Recommended ? Grains: Whole-grain pasta. Brown rice. Bulgar wheat. Polenta. Couscous. Whole-wheat bread. [place] Cobbatmeal.  Quinoa. ? Vegetables: Artichokes. Beets. Broccoli. Cabbage. Carrots. Eggplant. Green beans. Chard. Kale. Spinach. Onions. Leeks. Peas. Squash. Tomatoes. Peppers. Radishes. ? Fruits: Apples. Apricots. Avocado. Berries. Bananas. Cherries. Dates. Figs. Grapes. Lemons. Melon. Oranges. Peaches. Plums. Pomegranate. ? Meats and other protein foods: Beans. Almonds. Sunflower seeds. Pine nuts. Peanuts. Cod. Salmon. Scallops. Shrimp. Tuna. Tilapia. Clams. Oysters. Eggs. ? Dairy: Low-fat milk. Cheese. Greek yogurt. ? Beverages: Water. Red wine. Herbal tea. ? Fats and oils: Extra virgin olive oil. Avocado oil. Grape seed oil. ? Sweets and desserts: [place] yogurt with honey. Baked apples. Poached pears. Trail mix. ? Seasoning and other foods: Basil. Cilantro. Coriander. Cumin. Mint. Parsley. Sage. Rosemary. Tarragon. Garlic. Oregano. Thyme. Pepper. Balsalmic vinegar. Tahini. Hummus. Tomato sauce. Olives. Mushrooms. ? Limit these ? Grains: Prepackaged pasta or rice dishes. Prepackaged cereal with added sugar. ? Vegetables: Deep fried potatoes (french fries). ? Fruits: Fruit canned in syrup. ? Meats and other protein foods: Beef. Pork. Lamb. Poultry with skin. Hot dogs. Tomasa BlaseBacon. ? Dairy: Ice cream. Sour cream. Whole milk. ? Beverages: Juice. Sugar-sweetened soft drinks. Beer. Liquor and spirits. ? Fats and oils: Butter. Canola oil. Vegetable oil. Beef fat (tallow). Lard. ? Sweets and desserts: Cookies. Cakes. Pies. Candy. ? Seasoning and other foods: Mayonnaise. Premade sauces and marinades. ? The items listed may not be a complete list. Talk with your dietitian about what dietary choices are right for you. Summary  The Mediterranean diet includes both food and lifestyle choices.  Eat a variety of fresh fruits and vegetables, beans, nuts, seeds, and whole grains.  Limit the amount of red meat and sweets that you eat.  Talk with your health care provider about whether it is safe for you to drink red wine in  moderation. This means 1 glass a day for nonpregnant women and 2 glasses a day for men. A glass of wine equals 5 oz (150 mL). This information is not intended to replace advice given to you by your health care provider. Make sure you discuss any questions you have with your health care provider. Document Released: 07/31/2016 Document Revised: 09/02/2016 Document Reviewed: 07/31/2016 Elsevier Interactive Patient Education  2019 [employer]   Health Maintenance, Male A healthy lifestyle and preventive care is important for your health and wellness. Ask your health care provider about what schedule of regular examinations is right for you. What should I know about weight and diet? Eat a Healthy Diet  Eat plenty of vegetables, fruits, whole grains, low-fat dairy products, and lean protein.  Do not eat a lot of foods high in solid fats, added sugars, or salt.  Maintain a Healthy Weight Regular exercise can help you achieve or maintain a healthy weight. You should:  Do at least 150 minutes of exercise each week. The exercise should increase your heart rate and make you sweat (moderate-intensity exercise).  Do strength-training exercises at least twice a week. Watch Your Levels of Cholesterol and Blood Lipids  Have your blood tested for lipids and  cholesterol every 5 years starting at 45 years of age. If you are at high risk for heart disease, you should start having your blood tested when you are 45 years old. You may need to have your cholesterol levels checked more often if: ? Your lipid or cholesterol levels are high. ? You are older than 45 years of age. ? You are at high risk for heart disease. What should I know about cancer screening? Many types of cancers can be detected early and may often be prevented. Lung Cancer  You should be screened every year for lung cancer if: ? You are a current smoker who has smoked for at least 30 years. ? You are a former smoker who has quit within  the past 15 years.  Talk to your health care provider about your screening options, when you should start screening, and how often you should be screened. Colorectal Cancer  Routine colorectal cancer screening usually begins at 45 years of age and should be repeated every 5-10 years until you are 45 years old. You may need to be screened more often if early forms of precancerous polyps or small growths are found. Your health care provider may recommend screening at an earlier age if you have risk factors for colon cancer.  Your health care provider may recommend using home test kits to check for hidden blood in the stool.  A small camera at the end of a tube can be used to examine your colon (sigmoidoscopy or colonoscopy). This checks for the earliest forms of colorectal cancer. Prostate and Testicular Cancer  Depending on your age and overall health, your health care provider may do certain tests to screen for prostate and testicular cancer.  Talk to your health care provider about any symptoms or concerns you have about testicular or prostate cancer. Skin Cancer  Check your skin from head to toe regularly.  Tell your health care provider about any new moles or changes in moles, especially if: ? There is a change in a mole's size, shape, or color. ? You have a mole that is larger than a pencil eraser.  Always use sunscreen. Apply sunscreen liberally and repeat throughout the day.  Protect yourself by wearing long sleeves, pants, a wide-brimmed hat, and sunglasses when outside. What should I know about heart disease, diabetes, and high blood pressure?  If you are 4318-45 years of age, have your blood pressure checked every 3-5 years. If you are 440 years of age or older, have your blood pressure checked every year. You should have your blood pressure measured twice-once when you are at a hospital or clinic, and once when you are not at a hospital or clinic. Record the average of the two  measurements. To check your blood pressure when you are not at a hospital or clinic, you can use: ? An automated blood pressure machine at a pharmacy. ? A home blood pressure monitor.  Talk to your health care provider about your target blood pressure.  If you are between 3545-45 years old, ask your health care provider if you should take aspirin to prevent heart disease.  Have regular diabetes screenings by checking your fasting blood sugar level. ? If you are at a normal weight and have a low risk for diabetes, have this test once every three years after the age of 45. ? If you are overweight and have a high risk for diabetes, consider being tested at a younger age or more often.  A  one-time screening for abdominal aortic aneurysm (AAA) by ultrasound is recommended for men aged 65-75 years who are current or former smokers. What should I know about preventing infection? Hepatitis B If you have a higher risk for hepatitis B, you should be screened for this virus. Talk with your health care provider to find out if you are at risk for hepatitis B infection. Hepatitis C Blood testing is recommended for:  Everyone born from [birth year] through 1967.  Anyone with known risk factors for hepatitis C. Sexually Transmitted Diseases (STDs)  You should be screened each year for STDs including gonorrhea and chlamydia if: ? You are sexually active and are younger than 45 years of age. ? You are older than 45 years of age and your health care provider tells you that you are at risk for this type of infection. ? Your sexual activity has changed since you were last screened and you are at an increased risk for chlamydia or gonorrhea. Ask your health care provider if you are at risk.  Talk with your health care provider about whether you are at high risk of being infected with HIV. Your health care provider may recommend a prescription medicine to help prevent HIV infection. What else can I do?  Schedule  regular health, dental, and eye exams.  Stay current with your vaccines (immunizations).  Do not use any tobacco products, such as cigarettes, chewing tobacco, and e-cigarettes. If you need help quitting, ask your health care provider.  Limit alcohol intake to no more than 2 drinks per day. One drink equals 12 ounces of beer, 5 ounces of wine, or 1 ounces of hard liquor.  Do not use street drugs.  Do not share needles.  Ask your health care provider for help if you need support or information about quitting drugs.  Tell your health care provider if you often feel depressed.  Tell your health care provider if you have ever been abused or do not feel safe at home. This information is not intended to replace advice given to you by your health care provider. Make sure you discuss any questions you have with your health care provider. Document Released: 06/05/2008 Document Revised: 08/06/2016 Document Reviewed: 09/11/2015 Elsevier Interactive Patient Education  2019 [employer].

## 2019-01-17 ENCOUNTER — Other Ambulatory Visit: Payer: Self-pay | Admitting: Nurse Practitioner

## 2019-01-17 DIAGNOSIS — I10 Essential (primary) hypertension: Secondary | ICD-10-CM

## 2019-06-27 ENCOUNTER — Ambulatory Visit: Payer: 59 | Admitting: Nurse Practitioner

## 2019-07-22 ENCOUNTER — Other Ambulatory Visit: Payer: Self-pay | Admitting: *Deleted

## 2019-07-22 DIAGNOSIS — I10 Essential (primary) hypertension: Secondary | ICD-10-CM

## 2019-07-22 MED ORDER — METOPROLOL SUCCINATE ER 25 MG PO TB24: 25.0000 mg | ORAL_TABLET | Freq: Every day | ORAL | 0 refills | Status: DC

## 2019-10-17 ENCOUNTER — Other Ambulatory Visit: Payer: Self-pay | Admitting: Internal Medicine

## 2019-10-17 DIAGNOSIS — I10 Essential (primary) hypertension: Secondary | ICD-10-CM

## 2019-10-20 ENCOUNTER — Telehealth: Payer: Self-pay | Admitting: Nurse Practitioner

## 2019-10-20 NOTE — Telephone Encounter (Signed)
Copied from [place] [id number]. Topic: Appointment Scheduling - Scheduling Inquiry for Clinic >> Oct 20, 2019  3:46 PM Julian Swanson wrote: Patient requesting a transfer from [name] to anyone in the office. Patient was seen in Jan and needs med refills.

## 2019-10-21 NOTE — Telephone Encounter (Signed)
Let Dr. Jenny Reichmann decide first- if he says no, I can see him.

## 2019-10-21 NOTE — Telephone Encounter (Signed)
Ok with me, sure

## 2019-10-28 NOTE — Telephone Encounter (Signed)
Scheduled

## 2019-10-31 ENCOUNTER — Other Ambulatory Visit: Payer: Self-pay

## 2019-10-31 ENCOUNTER — Encounter: Payer: Self-pay | Admitting: Internal Medicine

## 2019-10-31 ENCOUNTER — Ambulatory Visit (INDEPENDENT_AMBULATORY_CARE_PROVIDER_SITE_OTHER): Payer: 59 | Admitting: Internal Medicine

## 2019-10-31 VITALS — BP 126/84 | HR 88 | Temp 98.9°F | Ht 68.0 in | Wt 215.0 lb

## 2019-10-31 DIAGNOSIS — J309 Allergic rhinitis, unspecified: Secondary | ICD-10-CM | POA: Diagnosis not present

## 2019-10-31 DIAGNOSIS — E538 Deficiency of other specified B group vitamins: Secondary | ICD-10-CM

## 2019-10-31 DIAGNOSIS — I10 Essential (primary) hypertension: Secondary | ICD-10-CM

## 2019-10-31 DIAGNOSIS — E785 Hyperlipidemia, unspecified: Secondary | ICD-10-CM | POA: Diagnosis not present

## 2019-10-31 DIAGNOSIS — Z0000 Encounter for general adult medical examination without abnormal findings: Secondary | ICD-10-CM

## 2019-10-31 DIAGNOSIS — E611 Iron deficiency: Secondary | ICD-10-CM

## 2019-10-31 DIAGNOSIS — E559 Vitamin D deficiency, unspecified: Secondary | ICD-10-CM

## 2019-10-31 HISTORY — DX: Allergic rhinitis, unspecified: J30.9

## 2019-10-31 HISTORY — DX: Hyperlipidemia, unspecified: E78.5

## 2019-10-31 MED ORDER — METOPROLOL SUCCINATE ER 25 MG PO TB24: 25.0000 mg | ORAL_TABLET | Freq: Every day | ORAL | 3 refills | Status: DC

## 2019-10-31 NOTE — Patient Instructions (Signed)
Please continue all other medications as before, and refills have been done if requested.  Please have the pharmacy call with any other refills you may need.  Please continue your efforts at being more active, low cholesterol diet, and weight control.  You are otherwise up to date with prevention measures today.  Please keep your appointments with your specialists as you may have planned  Please return in 3 months, or sooner if needed 

## 2019-10-31 NOTE — Progress Notes (Signed)
Subjective:    Patient ID: Julian Swanson, male    DOB: 12-Mar-1976, 43 y.o.   MRN: [id number]  HPI  Here to f/u; overall doing ok,  Pt denies chest pain, increasing sob or doe, wheezing, orthopnea, PND, increased LE swelling, palpitations, dizziness or syncope.  Pt denies new neurological symptoms such as new headache, or facial or extremity weakness or numbness.  Pt denies polydipsia, polyuria, or low sugar episode.  Pt states overall good compliance with meds, mostly trying to follow appropriate diet, with wt overall stable,  but little exercise however.  BP has been increased since ran out of metoprolol since 5 days.  BP at home has been 154/104, but similar to today when on the metoprolol. BP Readings from Last 3 Encounters:  10/31/19 126/84  01/10/19 (!) 160/90  03/22/18 (!) 150/100   Past Medical History:  Diagnosis Date  . Elevated blood pressure reading without diagnosis of hypertension    Whitecoat syndrome  . Hypertension    No past surgical history on file.  reports that he has never smoked. He has never used smokeless tobacco. He reports that he does not drink alcohol or use drugs. family history includes Diabetes in his unknown relative; Hypertension in his father, mother, and unknown relative. Allergies  Allergen Reactions  . Amoxicillin-Pot Clavulanate     Rash on thighs  . Penicillins     See Augmentin reaction  . Sulfa Antibiotics     05/19/14 urticaria  . Prednisone     No definitive reaction 05/19/14 known reaction denied   Current Outpatient Medications on File Prior to Visit  Medication Sig Dispense Refill  . Aluminum & Magnesium Hydroxide (MAGNESIUM-ALUMINUM PO) Take by mouth.    . Ascorbic Acid (VITAMIN C) 100 MG tablet Take 100 mg by mouth daily.    . Cholecalciferol (D3 ADULT PO) Take by mouth.    . hydrocortisone (ANUSOL-HC) 2.5 % rectal cream Place 1 application rectally 2 (two) times daily. 30 g 0  . KRILL OIL PO Take by mouth.     No current  facility-administered medications on file prior to visit.    Review of Systems  Constitutional: Negative for other unusual diaphoresis or sweats HENT: Negative for ear discharge or swelling Eyes: Negative for other worsening visual disturbances Respiratory: Negative for stridor or other swelling  Gastrointestinal: Negative for worsening distension or other blood Genitourinary: Negative for retention or other urinary change Musculoskeletal: Negative for other MSK pain or swelling Skin: Negative for color change or other new lesions Neurological: Negative for worsening tremors and other numbness  Psychiatric/Behavioral: Negative for worsening agitation or other fatigue All otherwise neg per pt     Objective:   Physical Exam BP 126/84   Pulse 88   Temp 98.9 F (37.2 C) (Oral)   Ht 5\' 8"  (1.727 m)   Wt 215 lb (97.5 kg)   SpO2 97%   BMI 32.69 kg/m  VS noted,  Constitutional: Pt appears in NAD HENT: Head: NCAT.  Right Ear: External ear normal.  Left Ear: External ear normal.  Eyes: . Pupils are equal, round, and reactive to light. Conjunctivae and EOM are normal Nose: without d/c or deformity Neck: Neck supple. Gross normal ROM Cardiovascular: Normal rate and regular rhythm.   Pulmonary/Chest: Effort normal and breath sounds without rales or wheezing.  Abd:  Soft, NT, ND, + BS, no organomegaly Neurological: Pt is alert. At baseline orientation, motor grossly intact Skin: Skin is warm. No rashes, other new  lesions, no LE edema Psychiatric: Pt behavior is normal without agitation  All otherwise neg per pt Lab Results  Component Value Date   WBC 6.1 01/10/2019   HGB 16.6 01/10/2019   HCT 48.6 01/10/2019   PLT 184.0 01/10/2019   GLUCOSE 86 01/10/2019   CHOL 196 01/10/2019   TRIG 76.0 01/10/2019   HDL 54.10 01/10/2019   LDLCALC 127 (H) 01/10/2019   ALT 21 01/10/2019   AST 19 01/10/2019   NA 141 01/10/2019   K 4.4 01/10/2019   CL 104 01/10/2019   CREATININE 1.20  01/10/2019   BUN 15 01/10/2019   CO2 29 01/10/2019   TSH 1.47 01/10/2019   PSA 0.53 01/10/2019   HGBA1C 5.3 01/05/2018          Assessment & Plan:

## 2019-10-31 NOTE — Assessment & Plan Note (Signed)
Ok for [employer] and [employer] as needed

## 2019-10-31 NOTE — Assessment & Plan Note (Signed)
For lower chol diet, declines statin for now or cardiac ct scoring

## 2019-10-31 NOTE — Assessment & Plan Note (Signed)
BP elevated at home moderately, to restart the toprol xl 25, o/w stable overall by history and exam, recent data reviewed with pt, and pt to continue medical treatment as before,  to f/u any worsening symptoms or concerns , check BP at home and call in 1-2 wks if still elevated

## 2019-11-25 ENCOUNTER — Telehealth: Payer: Self-pay | Admitting: Internal Medicine

## 2019-11-25 NOTE — Telephone Encounter (Signed)
Pt has been informed.

## 2019-11-25 NOTE — Telephone Encounter (Signed)
Sorry, I am not familiar with that name for a cream, and I am unable to write note for no mask at work. thanks

## 2019-11-25 NOTE — Telephone Encounter (Signed)
Pt stated he mentioned needing a rx for propyzole cream during his New Patient appt but did not see that it was sent to pharmacy. Please advise.  Pt also stated he has a hard time breathing with a mask and would like to know if Dr. Jenny Reichmann can write a letter excusing him from wearing it at work. Please advise.

## 2019-12-25 ENCOUNTER — Ambulatory Visit (HOSPITAL_COMMUNITY)
Admission: EM | Admit: 2019-12-25 | Discharge: 2019-12-25 | Disposition: A | Discharge status: Home / Self Care | Payer: 59 | Attending: Family Medicine | Admitting: Family Medicine

## 2019-12-25 ENCOUNTER — Encounter (HOSPITAL_COMMUNITY): Payer: Self-pay | Admitting: Emergency Medicine

## 2019-12-25 ENCOUNTER — Other Ambulatory Visit: Payer: Self-pay

## 2019-12-25 DIAGNOSIS — Z20822 Contact with and (suspected) exposure to covid-19: Secondary | ICD-10-CM

## 2019-12-25 DIAGNOSIS — R1032 Left lower quadrant pain: Secondary | ICD-10-CM

## 2019-12-25 LAB — CBC WITH DIFFERENTIAL/PLATELET
Abs Immature Granulocytes: 0.08 10*3/uL — ABNORMAL HIGH (ref 0.00–0.07)
Basophils Absolute: 0 10*3/uL (ref 0.0–0.1)
Basophils Relative: 0 %
Eosinophils Absolute: 0 10*3/uL (ref 0.0–0.5)
Eosinophils Relative: 0 %
HCT: 45.6 % (ref 39.0–52.0)
Hemoglobin: 16.3 g/dL (ref 13.0–17.0)
Immature Granulocytes: 1 %
Lymphocytes Relative: 8 %
Lymphs Abs: 1.4 10*3/uL (ref 0.7–4.0)
MCH: 32.6 pg (ref 26.0–34.0)
MCHC: 35.7 g/dL (ref 30.0–36.0)
MCV: 91.2 fL (ref 80.0–100.0)
Monocytes Absolute: 1.4 10*3/uL — ABNORMAL HIGH (ref 0.1–1.0)
Monocytes Relative: 8 %
Neutro Abs: 14.2 10*3/uL — ABNORMAL HIGH (ref 1.7–7.7)
Neutrophils Relative %: 83 %
Platelets: 193 10*3/uL (ref 150–400)
RBC: 5 MIL/uL (ref 4.22–5.81)
RDW: 12.1 % (ref 11.5–15.5)
WBC: 17.1 10*3/uL — ABNORMAL HIGH (ref 4.0–10.5)
nRBC: 0 % (ref 0.0–0.2)

## 2019-12-25 LAB — POC SARS CORONAVIRUS 2 AG: SARS Coronavirus 2 Ag: NEGATIVE

## 2019-12-25 LAB — POC SARS CORONAVIRUS 2 AG -  ED
SARS Coronavirus 2 Ag: NEGATIVE
SARS Coronavirus 2 Ag: NEGATIVE

## 2019-12-25 MED ORDER — METRONIDAZOLE 500 MG PO TABS: 500.0000 mg | ORAL_TABLET | Freq: Two times a day (BID) | ORAL | 0 refills | Status: AC

## 2019-12-25 MED ORDER — CIPROFLOXACIN HCL 500 MG PO TABS: 500.0000 mg | ORAL_TABLET | Freq: Two times a day (BID) | ORAL | 0 refills | Status: AC

## 2019-12-25 NOTE — ED Notes (Signed)
Theresa Mulligan, RN obtained the rapid Covid specimen, Not Lina Sayre, RT(R)

## 2019-12-25 NOTE — ED Triage Notes (Signed)
Mild abdominal pain started yesterday.  Gradually worsened since onset, hot and chills, body aches.  This morning temp was 100.8.  Denies diarrhea.  Normal BM yesterday morning and 2 small stools yesterday afternoon.  Denies nausea or vomiting.  Patient complains of a lot of gas

## 2019-12-25 NOTE — Discharge Instructions (Addendum)
Contact your PCP office tomorrow to request follow-up evaluation and CT-of abdomen outpatient  imaging related to your visit today.Based on your exam today, I am initiating treatment for Diverticulitis which requires CT of abdomen imaging to confirm diagnosis. You will start treatment with Ciprofloxacin 500 mg twice daily and Metronidazole 500 mg twice daily for a period of 14 days. Take medication with food. I recommend bland diet for now and high fat and spicy food will worsen abdominal symptoms. Take BP medication as soon as you arrive home as your blood pressure and heart rate is elevated. Force fluids to hydrate as you are dehydrated due to fever. Take tylenol 650 mg every 4-6 hours for fever. If your symptoms worsen, prior to follow-up with primary care, please go immediately to the emergency department for further evaluation.

## 2019-12-25 NOTE — ED Provider Notes (Addendum)
[place]    CSN: [id number] Arrival date & time: 12/25/19  1037      History   Chief Complaint Chief Complaint  Patient presents with  . Abdominal Pain    HPI Julian Swanson is a 44 y.o. male.   HPI  Julian Swanson presents today with concern for fever, body aches and abdominal pain x 1 day. Checked temperature today and fever 100.8 pm. Abdomen distended and . Positional changes tend to improve abdominal pain. Lower left quadrant tenderness. Abdominal pain comes and goes. At present he rates pain as a 4/10. Pain level intermittently x 1 day increased to a high intensity. No prior episodes of colitis and diverticulitis. He has no taken any medication today and has remained NPO since reaching out to his after-hours nurse on-call line. Past Medical History:  Diagnosis Date  . Allergic rhinitis 10/31/2019  . HLD (hyperlipidemia) 10/31/2019  . Hypertension     Patient Active Problem List   Diagnosis Date Noted  . HLD (hyperlipidemia) 10/31/2019  . Allergic rhinitis 10/31/2019  . Encounter for general adult medical examination with abnormal findings 01/10/2019  . Ventral hernia 12/01/2016  . Essential hypertension 10/28/2016  . GILBERT'S SYNDROME 10/20/2007    History reviewed. No pertinent surgical history.     Home Medications    Prior to Admission medications   Medication Sig Start Date End Date Taking? Authorizing Provider  Aluminum & Magnesium Hydroxide (MAGNESIUM-ALUMINUM PO) Take by mouth.   Yes [provider]  Ascorbic Acid (VITAMIN C) 100 MG tablet Take 100 mg by mouth daily.   Yes [provider]  Cholecalciferol (D3 ADULT PO) Take by mouth.   Yes [provider]  KRILL OIL PO Take by mouth.   Yes [provider]  metoprolol succinate (TOPROL-XL) 25 MG 24 hr tablet Take 1 tablet (25 mg total) by mouth daily. 10/31/19  Yes Biagio Borg, MD  Zinc Sulfate (ZINC 15 PO) Take by mouth.   Yes [provider]  hydrocortisone (ANUSOL-HC) 2.5 % rectal cream Place 1 application rectally 2 (two) times daily. 12/16/18   Hoyt Koch, MD    Family History Family History  Problem Relation Age of Onset  . Diabetes Other        relation not spec.  Marland Kitchen Hypertension Other        relation not spec.  Marland Kitchen Hypertension Mother   . Hypertension Father     Social History Social History   Tobacco Use  . Smoking status: Never Smoker  . Smokeless tobacco: Never Used  Substance Use Topics  . Alcohol use: No  . Drug use: No     Allergies   Amoxicillin-pot clavulanate, Penicillins, Sulfa antibiotics, and Prednisone   Review of Systems Review of Systems  Pertinent negatives listed in HPI  Physical Exam Triage Vital Signs ED Triage Vitals  Enc Vitals Group     BP 12/25/19 1238 (!) 182/108     Pulse Rate 12/25/19 1238 (!) 116     Resp 12/25/19 1238 18     Temp 12/25/19 1238 (!) 100.4 F (38 C)     Temp Source 12/25/19 1238 Oral     SpO2 12/25/19 1238 97 %     Weight --      Height --      Head Circumference --      Peak Flow --      Pain Score 12/25/19 1235 5     Pain Loc --  Pain Edu? --      Excl. in [place]? --    No data found.  Updated Vital Signs BP (!) 161/92 (BP Location: Right Arm) Comment: did not take medicine today  Pulse (!) 116   Temp (!) 100.4 F (38 C) (Oral)   Resp 18   SpO2 97%   Visual Acuity Right Eye Distance:   Left Eye Distance:   Bilateral Distance:    Right Eye Near:   Left Eye Near:    Bilateral Near:     Physical Exam Constitutional:      Appearance: He is ill-appearing. He is not toxic-appearing.  HENT:     Head: Normocephalic.  Cardiovascular:     Rate and Rhythm: Regular rhythm. Tachycardia present.  Pulmonary:     Effort: Pulmonary effort is normal.     Breath sounds: Normal breath sounds. No wheezing or rhonchi.  Abdominal:     General: Bowel sounds are normal. There is no distension.     Tenderness: There is abdominal  tenderness in the periumbilical area, suprapubic area and left lower quadrant. Negative signs include McBurney's sign and psoas sign.     Hernia: No hernia is present.  Musculoskeletal:     Cervical back: Normal range of motion.  Lymphadenopathy:     Cervical: No cervical adenopathy.  Skin:    General: Skin is warm.  Neurological:     General: No focal deficit present.     Mental Status: He is alert.  Psychiatric:        Mood and Affect: Mood normal.      UC Treatments / Results  Labs (all labs ordered are listed, but only abnormal results are displayed) Labs Reviewed  POC SARS CORONAVIRUS 2 AG -  ED    EKG   Radiology No results found.  Procedures Procedures (including critical care time)  Medications Ordered in UC Medications - No data to display  Initial Impression / Assessment and Plan / UC Course  I have reviewed the triage vital signs and the nursing notes.  Pertinent labs & imaging results that were available during my care of the patient were reviewed by me and considered in my medical decision making (see chart for details).   Patient's history, presentation, and exam findings are consistent with symptoms of Acute Diverticulitis.  Patient was initially swabbed for COVID-19 as is vital signs met the criteria for screening.  This point care Covid was negative and after exam cancel confirmatory COVID-19 test.  Patient exam findings are consistent with that of diverticulitis.  At present there is an extensive wait time at the ER due to the current pandemic with COVID-19.  I will treat patient empirically for diverticulitis and have him follow-up with his PCP on tomorrow for evaluation and consideration of CT imaging to confirm diverticulitis on outpatient basis as we do not have this capability here at urgent care and I feel it is not prudent to send the patient to the ER as he is not toxic appearing which would likely increase his risk for possibly exposure to COVID-19.   We will also obtain a CBC to ensure white count is not greater than 20.  Patient is reliable and expresses that he will indeed go immediately to the ER if his symptoms worsen throughout the evening or do not improve with prescribed therapy.  He also confirms that he will follow-up with his primary care provider for splint tomorrow morning.  Patient verbalized agreement and understanding of plan  today.  Patient discharged with written instructions for follow-up. Final Clinical Impressions(s) / UC Diagnoses   Final diagnoses:  Left lower quadrant abdominal pain     Discharge Instructions     Contact your PCP office tomorrow to request follow-up evaluation and CT-of abdomen outpatient  imaging related to your visit today.Based on your exam today, I am initiating treatment for Diverticulitis which requires CT of abdomen imaging to confirm diagnosis. You will start treatment with Ciprofloxacin 500 mg twice daily and Metronidazole 500 mg twice daily for a period of 14 days. Take medication with food. I recommend bland diet for now and high fat and spicy food will worsen abdominal symptoms. Take BP medication as soon as you arrive home as your blood pressure and heart rate is elevated. Force fluids to hydrate as you are dehydrated due to fever. Take tylenol 650 mg every 4-6 hours for fever. If your symptoms worsen, prior to follow-up with primary care, please go immediately to the emergency department for further evaluation.    ED Prescriptions    Medication Sig Dispense Auth. Provider   metroNIDAZOLE (FLAGYL) 500 MG tablet Take 1 tablet (500 mg total) by mouth 2 (two) times daily for 14 days. 28 tablet Scot Jun, FNP   ciprofloxacin (CIPRO) 500 MG tablet Take 1 tablet (500 mg total) by mouth 2 (two) times daily for 14 days. 28 tablet Scot Jun, FNP     PDMP not reviewed this encounter.   Scot Jun, FNP 12/26/19 2306    Scot Jun, FNP 12/27/19 [phone]

## 2019-12-26 ENCOUNTER — Telehealth: Payer: Self-pay

## 2019-12-26 NOTE — Telephone Encounter (Signed)
Very sorry but this is highly irresponsible and irregular to email a demand for a CT scan  Please ask pt to make ROV to determine if this is necessary, or go to ED for worsening pain or fever or other unusual symptoms  It would be nice but not required to ask pt to name the UC and get records unless he was seen at [facility] facility

## 2019-12-26 NOTE — Telephone Encounter (Signed)
Please advise 

## 2019-12-26 NOTE — Telephone Encounter (Signed)
Copied from CRM [id number]. Topic: General - Other >> Dec 26, 2019  8:14 AM Wyonia Hough E wrote: Reason for CRM: Pt was seen at urgent care on 1.3.21 and he was advised to call PCP and have Dr. Jonny Ruiz order a CT scan asap/ Pt was diagnosed with Diverticulitis / please advise Pt about CT scan

## 2019-12-27 ENCOUNTER — Emergency Department (HOSPITAL_COMMUNITY)
Admission: EM | Admit: 2019-12-27 | Discharge: 2019-12-27 | Disposition: A | Discharge status: Home / Self Care | Payer: 59 | Attending: Emergency Medicine | Admitting: Emergency Medicine

## 2019-12-27 ENCOUNTER — Ambulatory Visit (INDEPENDENT_AMBULATORY_CARE_PROVIDER_SITE_OTHER): Payer: 59 | Admitting: Family

## 2019-12-27 ENCOUNTER — Encounter: Payer: Self-pay | Admitting: Family

## 2019-12-27 ENCOUNTER — Ambulatory Visit
Admission: RE | Admit: 2019-12-27 | Discharge: 2019-12-27 | Disposition: A | Discharge status: Home / Self Care | Payer: 59 | Source: Ambulatory Visit | Attending: Family | Admitting: Family

## 2019-12-27 ENCOUNTER — Other Ambulatory Visit: Payer: Self-pay | Admitting: Family

## 2019-12-27 ENCOUNTER — Other Ambulatory Visit: Payer: Self-pay

## 2019-12-27 ENCOUNTER — Encounter (HOSPITAL_COMMUNITY): Payer: Self-pay | Admitting: Family Medicine

## 2019-12-27 DIAGNOSIS — K5720 Diverticulitis of large intestine with perforation and abscess without bleeding: Secondary | ICD-10-CM | POA: Diagnosis not present

## 2019-12-27 DIAGNOSIS — R1032 Left lower quadrant pain: Secondary | ICD-10-CM | POA: Diagnosis present

## 2019-12-27 DIAGNOSIS — K631 Perforation of intestine (nontraumatic): Secondary | ICD-10-CM

## 2019-12-27 DIAGNOSIS — Z79899 Other long term (current) drug therapy: Secondary | ICD-10-CM | POA: Diagnosis not present

## 2019-12-27 DIAGNOSIS — K5792 Diverticulitis of intestine, part unspecified, without perforation or abscess without bleeding: Secondary | ICD-10-CM

## 2019-12-27 DIAGNOSIS — I10 Essential (primary) hypertension: Secondary | ICD-10-CM | POA: Insufficient documentation

## 2019-12-27 LAB — COMPREHENSIVE METABOLIC PANEL
ALT: 20 U/L (ref 0–44)
AST: 18 U/L (ref 15–41)
Albumin: 4.3 g/dL (ref 3.5–5.0)
Alkaline Phosphatase: 46 U/L (ref 38–126)
Anion gap: 11 (ref 5–15)
BUN: 14 mg/dL (ref 6–20)
CO2: 25 mmol/L (ref 22–32)
Calcium: 9.4 mg/dL (ref 8.9–10.3)
Chloride: 104 mmol/L (ref 98–111)
Creatinine, Ser: 1.07 mg/dL (ref 0.61–1.24)
GFR calc Af Amer: >60 mL/min (ref >60)
GFR calc non Af Amer: >60 mL/min (ref >60)
Glucose, Bld: 87 mg/dL (ref 70–99)
Potassium: 3.8 mmol/L (ref 3.5–5.1)
Sodium: 140 mmol/L (ref 135–145)
Total Bilirubin: 1.3 mg/dL — ABNORMAL HIGH (ref 0.3–1.2)
Total Protein: 7.8 g/dL (ref 6.5–8.1)

## 2019-12-27 LAB — CBC WITH DIFFERENTIAL/PLATELET
Abs Immature Granulocytes: 0.03 10*3/uL (ref 0.00–0.07)
Basophils Absolute: 0.1 10*3/uL (ref 0.0–0.1)
Basophils Relative: 1 %
Eosinophils Absolute: 0.1 10*3/uL (ref 0.0–0.5)
Eosinophils Relative: 1 %
HCT: 46.6 % (ref 39.0–52.0)
Hemoglobin: 16.4 g/dL (ref 13.0–17.0)
Immature Granulocytes: 0 %
Lymphocytes Relative: 19 %
Lymphs Abs: 1.5 10*3/uL (ref 0.7–4.0)
MCH: 33.1 pg (ref 26.0–34.0)
MCHC: 35.2 g/dL (ref 30.0–36.0)
MCV: 94 fL (ref 80.0–100.0)
Monocytes Absolute: 0.7 10*3/uL (ref 0.1–1.0)
Monocytes Relative: 9 %
Neutro Abs: 5.5 10*3/uL (ref 1.7–7.7)
Neutrophils Relative %: 70 %
Platelets: 202 10*3/uL (ref 150–400)
RBC: 4.96 MIL/uL (ref 4.22–5.81)
RDW: 11.9 % (ref 11.5–15.5)
WBC: 7.9 10*3/uL (ref 4.0–10.5)
nRBC: 0 % (ref 0.0–0.2)

## 2019-12-27 LAB — LACTIC ACID, PLASMA: Lactic Acid, Venous: 1.3 mmol/L (ref 0.5–1.9)

## 2019-12-27 MED ORDER — IOPAMIDOL (ISOVUE-300) INJECTION 61%
100.0000 mL | Freq: Once | INTRAVENOUS | Status: AC | PRN
  Administered 2019-12-27: 100 mL via INTRAVENOUS

## 2019-12-27 MED ORDER — POLYETHYLENE GLYCOL 3350 17 G PO PACK: 17.0000 g | PACK | Freq: Every day | ORAL | 0 refills | Status: DC

## 2019-12-27 NOTE — ED Triage Notes (Signed)
Patient was diagnosed with diverticulitis on Sunday at Urgent Care with a low grade fever and elevated WBC. He was started on Flagyl and Cipro. Since, he has had "looser" than normal stools. He had a CT abd and pelvis can today with critical results of microperforation noted along the posterior leftward aspect of the diverticulitis area. No abscess or focal fluid seen. Spoke with Dr. Rubin Payor about patients CT results and obtain orders. Patient denies pain and appears to look in no acute distress, despite elevated BP. He states he takes BP medication.

## 2019-12-27 NOTE — Progress Notes (Signed)
Julian Swanson is a 44 y.o. male with the following history as recorded in EpicCare:  Patient Active Problem List   Diagnosis Date Noted  . HLD (hyperlipidemia) 10/31/2019  . Allergic rhinitis 10/31/2019  . Encounter for general adult medical examination with abnormal findings 01/10/2019  . Ventral hernia 12/01/2016  . Essential hypertension 10/28/2016  . GILBERT'S SYNDROME 10/20/2007    Current Outpatient Medications  Medication Sig Dispense Refill  . Aluminum & Magnesium Hydroxide (MAGNESIUM-ALUMINUM PO) Take by mouth.    . Ascorbic Acid (VITAMIN C) 100 MG tablet Take 100 mg by mouth daily.    . Cholecalciferol (D3 ADULT PO) Take by mouth.    . ciprofloxacin (CIPRO) 500 MG tablet Take 1 tablet (500 mg total) by mouth 2 (two) times daily for 14 days. 28 tablet 0  . hydrocortisone (ANUSOL-HC) 2.5 % rectal cream Place 1 application rectally 2 (two) times daily. 30 g 0  . KRILL OIL PO Take by mouth.    . metoprolol succinate (TOPROL-XL) 25 MG 24 hr tablet Take 1 tablet (25 mg total) by mouth daily. 90 tablet 3  . metroNIDAZOLE (FLAGYL) 500 MG tablet Take 1 tablet (500 mg total) by mouth 2 (two) times daily for 14 days. 28 tablet 0  . Zinc Sulfate (ZINC 15 PO) Take by mouth.     No current facility-administered medications for this visit.    Allergies: Amoxicillin-pot clavulanate, Penicillins, Sulfa antibiotics, and Prednisone  Past Medical History:  Diagnosis Date  . Allergic rhinitis 10/31/2019  . HLD (hyperlipidemia) 10/31/2019  . Hypertension     No past surgical history on file.  Family History  Problem Relation Age of Onset  . Diabetes Other        relation not spec.  Marland Kitchen Hypertension Other        relation not spec.  Marland Kitchen Hypertension Mother   . Hypertension Father     Social History   Tobacco Use  . Smoking status: Never Smoker  . Smokeless tobacco: Never Used  Substance Use Topics  . Alcohol use: No    Subjective:    I connected with Julian Swanson on  12/27/19 at  9:00 AM EST by a telephone call and verified that I am speaking with the correct person using two identifiers.   I discussed the limitations of evaluation and management by telemedicine and the availability of in person appointments. The patient expressed understanding and agreed to proceed. Provider in office/ patient is at home; provider and patient are only 2 people on video call.   Patient was seen at [facility] on Sunday, January 3 with LLQ pain/ low grade fever; WBC was elevated at 17; diagnosed with diverticulitis but was told that needed to call his PCP to get set up for abdominal CT; was started on Flagyl and Cipro and notes that he has been feeling better since starting his medication. Notes that bowel movements have been "looser" than normal since starting medication but denies any blood in stool; no prior history of diverticulitis/ no previous colonoscopy; notes that 10-12 years ago had 2 month episode of "increased gas."     Objective:  There were no vitals filed for this visit.  General: Well developed, well nourished, in no acute distress  Lungs: Respirations unlabored;  Neurologic: Alert and oriented; speech intact;  Assessment:  1. Left lower quadrant abdominal pain     Plan:  Schedule for STAT abd/ pelvic CT (today if possible); stay on current antibiotics; will most likely  need GI referral for colonoscopy. Follow-up to be determined.   Time spent 20 minutes  No follow-ups on file.  Orders Placed This Encounter  Procedures  . CT Abdomen Pelvis W Contrast    Standing Status:   Future    Standing Expiration Date:   03/26/2021    Order Specific Question:   If indicated for the ordered procedure, I authorize the administration of contrast media per Radiology protocol    Answer:   Yes    Order Specific Question:   Preferred imaging location?    Answer:   [id number]    Order Specific Question:   Release to patient    Answer:   Immediate    Order Specific  Question:   Is Oral Contrast requested for this exam?    Answer:   Yes, Per Radiology protocol    Order Specific Question:   Radiology Contrast Protocol - do NOT remove file path    Answer:   [email or web address]    Requested Prescriptions    No prescriptions requested or ordered in this encounter

## 2019-12-27 NOTE — ED Provider Notes (Signed)
[facility]-EMERGENCY DEPT Provider Note   CSN: [id number] Arrival date & time: 12/27/19  1523     History Chief Complaint  Patient presents with  . Abnormal Abd CT results    Julian Swanson is a 44 y.o. male.  HPI Patient presents with perforated diverticulitis.  Today is Tuesday and on Saturday began to have abdominal pain.  Is in the left lower abdomen.  Became more severe as the night went on.  States that on Sunday morning went to urgent care and was started on antibiotics.  Appears to be Cipro and Flagyl.  Also instructed to have PCP order CT scan for presumed diverticulitis.  Patient has had some fevers on Saturday but since then fevers improved.  Has not had much oral intake but states he has been eating soup.  No further fevers.  Dull continued abdominal pain.  States however he is feeling okay.  PCP discussed with general surgery and they requested ER evaluation.    Past Medical History:  Diagnosis Date  . Allergic rhinitis 10/31/2019  . HLD (hyperlipidemia) 10/31/2019  . Hypertension     Patient Active Problem List   Diagnosis Date Noted  . HLD (hyperlipidemia) 10/31/2019  . Allergic rhinitis 10/31/2019  . Encounter for general adult medical examination with abnormal findings 01/10/2019  . Ventral hernia 12/01/2016  . Essential hypertension 10/28/2016  . GILBERT'S SYNDROME 10/20/2007    History reviewed. No pertinent surgical history.     Family History  Problem Relation Age of Onset  . Diabetes Other        relation not spec.  Marland Kitchen Hypertension Other        relation not spec.  Marland Kitchen Hypertension Mother   . Hypertension Father     Social History   Tobacco Use  . Smoking status: Never Smoker  . Smokeless tobacco: Never Used  Substance Use Topics  . Alcohol use: No  . Drug use: No    Home Medications Prior to Admission medications   Medication Sig Start Date End Date Taking? Authorizing Provider  Aluminum & Magnesium Hydroxide  (MAGNESIUM-ALUMINUM PO) Take 1 tablet by mouth daily.    Yes [provider]  Ascorbic Acid (VITAMIN C) 100 MG tablet Take 100 mg by mouth daily.   Yes [provider]  Cholecalciferol (D3 ADULT PO) Take 1 tablet by mouth daily.    Yes [provider]  ciprofloxacin (CIPRO) 500 MG tablet Take 1 tablet (500 mg total) by mouth 2 (two) times daily for 14 days. 12/25/19 01/08/20 Yes Bing Neighbors, FNP  KRILL OIL PO Take 1 tablet by mouth daily.    Yes [provider]  metoprolol succinate (TOPROL-XL) 25 MG 24 hr tablet Take 1 tablet (25 mg total) by mouth daily. 10/31/19  Yes Corwin Levins, MD  metroNIDAZOLE (FLAGYL) 500 MG tablet Take 1 tablet (500 mg total) by mouth 2 (two) times daily for 14 days. 12/25/19 01/08/20 Yes Bing Neighbors, FNP  Zinc Sulfate (ZINC 15 PO) Take 1 tablet by mouth daily.    Yes [provider]  hydrocortisone (ANUSOL-HC) 2.5 % rectal cream Place 1 application rectally 2 (two) times daily. Patient not taking: Reported on 12/27/2019 12/16/18   Myrlene Broker, MD  polyethylene glycol (MIRALAX / GLYCOLAX) 17 g packet Take 17 g by mouth daily. 12/27/19   Benjiman Core, MD    Allergies    Amoxicillin-pot clavulanate, Penicillins, Sulfa antibiotics, and Prednisone  Review of Systems  Review of Systems  Constitutional: Positive for appetite change and fever.  HENT: Negative for congestion.   Respiratory: Negative for shortness of breath.   Gastrointestinal: Positive for abdominal pain. Negative for constipation, nausea and vomiting.  Genitourinary: Negative for flank pain.  Musculoskeletal: Negative for back pain.  Skin: Negative for rash.  Neurological: Negative for weakness.  Psychiatric/Behavioral: Negative for confusion.    Physical Exam Updated Vital Signs BP (!) 174/106   Pulse 82   Temp 98.5 F (36.9 C) (Oral)   Resp 17   SpO2 95%   Physical Exam Vitals and nursing note reviewed.  HENT:     Head:  Normocephalic.     Mouth/Throat:     Mouth: Mucous membranes are moist.  Eyes:     General: Scleral icterus present.  Cardiovascular:     Rate and Rhythm: Regular rhythm.  Pulmonary:     Breath sounds: No wheezing or rhonchi.  Abdominal:     Tenderness: There is abdominal tenderness.     Comments: Lower abdominal tenderness without rebound or guarding.  Musculoskeletal:     Cervical back: Neck supple.     Right lower leg: No edema.     Left lower leg: No edema.  Skin:    General: Skin is warm.     Capillary Refill: Capillary refill takes less than 2 seconds.  Neurological:     Mental Status: He is alert and oriented to person, place, and time.  Psychiatric:        Mood and Affect: Mood normal.     ED Results / Procedures / Treatments   Labs (all labs ordered are listed, but only abnormal results are displayed) Labs Reviewed  COMPREHENSIVE METABOLIC PANEL - Abnormal; Notable for the following components:      Result Value   Total Bilirubin 1.3 (*)    All other components within normal limits  CBC WITH DIFFERENTIAL/PLATELET  LACTIC ACID, PLASMA    EKG None  Radiology CT Abdomen Pelvis W Contrast  Result Date: 12/27/2019 CLINICAL DATA:  Left lower quadrant pain and fever EXAM: CT ABDOMEN AND PELVIS WITH CONTRAST TECHNIQUE: Multidetector CT imaging of the abdomen and pelvis was performed using the standard protocol following bolus administration of intravenous contrast. CONTRAST:  ISOVUE-300 IOPAMIDOL (ISOVUE-300) INJECTION 61% Creatinine 1.2; GFR 73 COMPARISON:  None. FINDINGS: Lower chest: Lung bases clear. Hepatobiliary: No focal liver lesions are evident. The gallbladder wall is not appreciably thickened. There is no biliary duct dilatation. Pancreas: There is no pancreatic mass or inflammatory focus. Spleen: No splenic lesions are evident. Adrenals/Urinary Tract: Adrenals bilaterally appear unremarkable. Kidneys bilaterally show no evident mass or hydronephrosis on  either side. There is no renal or ureteral calculus on either side. Urinary bladder is midline with wall thickness within normal limits. Stomach/Bowel: There is wall thickening and surrounding mesenteric thickening in the proximal sigmoid colon region. There are several irregular diverticula in this area. No abscess or focal fluid is seen in this area of diverticulitis. Microperforation is noted along the posterior leftward aspect of this area of proximal sigmoid diverticulitis. Elsewhere, there are occasional descending colonic diverticula without inflammation. No evident bowel obstruction. The terminal ileum appears normal. There is no evident portal venous air. There is no pneumoperitoneum beyond the micro-perforation at the site of the focal diverticulitis. Vascular/Lymphatic: There is no abdominal aortic aneurysm. No vascular lesions are evident. Note that the major venous structures appear patent. There is no demonstrable adenopathy in the abdomen or pelvis. Reproductive: Prostate  and seminal vesicles are normal in size and contour. There is no evident pelvic mass. Other: The appendix appears normal. No evident abscess or ascites in the abdomen or pelvis. There is a focal umbilical hernia containing fat but no bowel. The neck of this umbilical hernia measures 1.8 cm from right to left dimension and 1.3 cm from superior to inferior dimension. There is a second midline ventral hernia located 7.5 cm superior to the umbilicus containing only fat. This hernia at its neck measures 1.4 cm from right to left dimension and 1.2 cm from superior to inferior dimension. There are fat containing umbilical hernias as well. No bowel containing hernias are evident. Musculoskeletal: There is a left-sided pars defect on the left. There is disc space narrowing at L3-4 with 6 mm of anterolisthesis of L3 on L4. No blastic or lytic bone lesions. No intramuscular lesions are evident. IMPRESSION: 1. CT findings are consistent with  acute diverticulitis of the proximal sigmoid colon. Microperforation is noted along the posterior leftward aspect of this area of diverticulitis. No abscess or focal fluid is seen in this area of diverticulitis. 2. There are 2 midline ventral hernias containing fat but no bowel. There are fat containing inguinal hernias bilaterally. 3. Left-sided pars defect at L4 with 6 mm of anterolisthesis of L3 on L4. 4. No bowel obstruction. No abscess in the abdomen or pelvis. Appendix appears normal. No pneumoperitoneum beyond the microperforation noted in the region of the diverticulitis. These results will be called to the ordering clinician or representative by the Radiologist Assistant, and communication documented in the PACS or zVision Dashboard. Electronically Signed   By: Lowella Grip III M.D.   On: 12/27/2019 10:54    Procedures Procedures (including critical care time)  Medications Ordered in ED Medications - No data to display  ED Course  I have reviewed the triage vital signs and the nursing notes.  Pertinent labs & imaging results that were available during my care of the patient were reviewed by me and considered in my medical decision making (see chart for details).    MDM Rules/Calculators/A&P                      Patient with abdominal pain.  Known diverticulitis with perforation from scan today.  However white count is gone from 17 down to 8 since "Sunday with today being Tuesday.  Patient has been feeling better over the last couple days.  Somewhat decreased oral intake but states it is more just lack of appetite.  Lab work reassuring.  Discussed with Dr. Kinsinger from general surgery.  We both think that he is probably a good candidate to be managed at home.  Has been doing well on Cipro and Flagyl but white count has decreased.  Patient's pain has improved.  Can follow-up in the office in 1 to 2 weeks. Final Clinical Impression(s) / ED Diagnoses Final diagnoses:  Perforation of  cecum due to diverticulitis    Rx / DC Orders ED Discharge Orders         Ordered    polyethylene glycol (MIRALAX / GLYCOLAX) 17 g packet  Daily     01" /05/21 1956           Davonna Belling, MD 12/27/19 [phone]

## 2019-12-27 NOTE — Telephone Encounter (Signed)
Called pt ---stated CT scan referred by Dr. Mahalia Longest morning. Pt stated CT scan show have hole in colon and possible needed surgery and waiting a call from Dr. Dayton Scrape to see what is the next step for treatment.

## 2019-12-27 NOTE — Telephone Encounter (Signed)
This likely represents a medical emergency  Please make sure verbally that Julian Swanson is aware of results.  thanks

## 2019-12-29 ENCOUNTER — Telehealth: Payer: Self-pay | Admitting: *Deleted

## 2019-12-29 MED ORDER — NYSTATIN 100000 UNIT/ML MT SUSP: 5.0000 mL | Freq: Four times a day (QID) | OROMUCOSAL | 0 refills | Status: DC

## 2019-12-29 NOTE — Telephone Encounter (Signed)
Notified pt w/MD response.../lmb 

## 2019-12-29 NOTE — Telephone Encounter (Signed)
I think ok to cont antibiotic for now if he is still taking the cipro  Ok for nystatin soln for the tongue - done erx  Naraly Fritcher [facility]. [facility] for immodium prn diarrhea, but should consider OV if has worsening pain, fever, diarrhea especially bad smelling, blood or other unusual symptoms

## 2019-12-29 NOTE — Telephone Encounter (Signed)
Copied from CRM [id number]. Topic: General - Other >> Dec 28, 2019  2:51 PM Jaquita Rector A wrote: Reason for CRM: Patient called to say that he have a coating on his tongue started this morning also have a small bout with diarrhea also. Asking if there is something that can be done Ph# [phone]

## 2019-12-30 NOTE — Telephone Encounter (Signed)
Julian Swanson has contacted pt per imaging tab.

## 2020-01-04 ENCOUNTER — Other Ambulatory Visit: Payer: Self-pay

## 2020-01-04 ENCOUNTER — Encounter: Payer: Self-pay | Admitting: Internal Medicine

## 2020-01-04 ENCOUNTER — Ambulatory Visit (INDEPENDENT_AMBULATORY_CARE_PROVIDER_SITE_OTHER): Payer: 59 | Admitting: Internal Medicine

## 2020-01-04 VITALS — BP 140/100 | HR 70 | Temp 99.4°F | Ht 68.0 in | Wt 213.0 lb

## 2020-01-04 DIAGNOSIS — Z0000 Encounter for general adult medical examination without abnormal findings: Secondary | ICD-10-CM | POA: Diagnosis not present

## 2020-01-04 DIAGNOSIS — K439 Ventral hernia without obstruction or gangrene: Secondary | ICD-10-CM

## 2020-01-04 DIAGNOSIS — K429 Umbilical hernia without obstruction or gangrene: Secondary | ICD-10-CM

## 2020-01-04 DIAGNOSIS — K5792 Diverticulitis of intestine, part unspecified, without perforation or abscess without bleeding: Secondary | ICD-10-CM | POA: Diagnosis not present

## 2020-01-04 NOTE — Assessment & Plan Note (Signed)
Asympt, f/u for worsening s/s

## 2020-01-04 NOTE — Assessment & Plan Note (Signed)
Asympt, f/u for worsening s/s 

## 2020-01-04 NOTE — Assessment & Plan Note (Signed)
Clinically resolved, exam benign, pt reassured, no lab or imaging needed, will need GI referral for colonoscopy

## 2020-01-04 NOTE — Progress Notes (Addendum)
Subjective:    Patient ID: Julian Swanson, male    DOB: 10-06-1976, 44 y.o.   MRN: [id number]  HPI  Here after recent acute diverticulitis with perforation after onset LLQ pain seen at ED jan 5 with CT c/w this; fortunately did not require hospn or surgury, managed as outpt with cipro flagyl with good med compliance, Since then pain has esentially resolved, no fever and Denies worsening reflux, abd pain, dysphagia, n/v, bowel change or blood.  Pt denies chest pain, increased sob or doe, wheezing, orthopnea, PND, increased LE swelling, palpitations, dizziness or syncope.   Pt denies polydipsia, polyuria, Has not had screening colonoscopy Past Medical History:  Diagnosis Date  . Allergic rhinitis 10/31/2019  . HLD (hyperlipidemia) 10/31/2019  . Hypertension    History reviewed. No pertinent surgical history.  reports that he has never smoked. He has never used smokeless tobacco. He reports that he does not drink alcohol or use drugs. family history includes Diabetes in an other family member; Hypertension in his father, mother, and another family member. Allergies  Allergen Reactions  . Amoxicillin-Pot Clavulanate     Rash on thighs  . Penicillins     See Augmentin reaction  . Sulfa Antibiotics     05/19/14 urticaria  . Prednisone     No definitive reaction 05/19/14 known reaction denied   Current Outpatient Medications on File Prior to Visit  Medication Sig Dispense Refill  . Aluminum & Magnesium Hydroxide (MAGNESIUM-ALUMINUM PO) Take 1 tablet by mouth daily.     . Ascorbic Acid (VITAMIN C) 100 MG tablet Take 100 mg by mouth daily.    . Cholecalciferol (D3 ADULT PO) Take 1 tablet by mouth daily.     . ciprofloxacin (CIPRO) 500 MG tablet Take 1 tablet (500 mg total) by mouth 2 (two) times daily for 14 days. 28 tablet 0  . hydrocortisone (ANUSOL-HC) 2.5 % rectal cream Place 1 application rectally 2 (two) times daily. 30 g 0  . KRILL OIL PO Take 1 tablet by mouth daily.     .  metoprolol succinate (TOPROL-XL) 25 MG 24 hr tablet Take 1 tablet (25 mg total) by mouth daily. 90 tablet 3  . metroNIDAZOLE (FLAGYL) 500 MG tablet Take 1 tablet (500 mg total) by mouth 2 (two) times daily for 14 days. 28 tablet 0  . polyethylene glycol (MIRALAX / GLYCOLAX) 17 g packet Take 17 g by mouth daily. 14 each 0  . Zinc Sulfate (ZINC 15 PO) Take 1 tablet by mouth daily.     Marland Kitchen nystatin (MYCOSTATIN) 100000 UNIT/ML suspension Take 5 mLs (500,000 Units total) by mouth 4 (four) times daily. (Patient not taking: Reported on 01/04/2020) 60 mL 0   No current facility-administered medications on file prior to visit.   Review of Systems  Constitutional: Negative for other unusual diaphoresis or sweats HENT: Negative for ear discharge or swelling Eyes: Negative for other worsening visual disturbances Respiratory: Negative for stridor or other swelling  Gastrointestinal: Negative for worsening distension or other blood Genitourinary: Negative for retention or other urinary change Musculoskeletal: Negative for other MSK pain or swelling Skin: Negative for color change or other new lesions Neurological: Negative for worsening tremors and other numbness  Psychiatric/Behavioral: Negative for worsening agitation or other fatigue All otherwise neg per pt     Objective:   Physical Exam BP (!) 140/100 (BP Location: Left Arm, Patient Position: Sitting, Cuff Size: Large)   Pulse 70   Temp 99.4 F (37.4 C) (Oral)  Ht 5\' 8"  (1.727 m)   Wt 213 lb (96.6 kg)   SpO2 99%   BMI 32.39 kg/m  VS noted,  Constitutional: Pt appears in NAD HENT: Head: NCAT.  Right Ear: External ear normal.  Left Ear: External ear normal.  Eyes: . Pupils are equal, round, and reactive to light. Conjunctivae and EOM are normal Nose: without d/c or deformity Neck: Neck supple. Gross normal ROM Cardiovascular: Normal rate and regular rhythm.   Pulmonary/Chest: Effort normal and breath sounds without rales or wheezing.    Abd:  Soft, NT, ND, + BS, no organomegaly Neurological: Pt is alert. At baseline orientation, motor grossly intact Skin: Skin is warm. No rashes, other new lesions, no LE edema Psychiatric: Pt behavior is normal without agitation  All otherwise neg per pt Lab Results  Component Value Date   WBC 7.9 12/27/2019   HGB 16.4 12/27/2019   HCT 46.6 12/27/2019   PLT 202 12/27/2019   GLUCOSE 87 12/27/2019   CHOL 196 01/10/2019   TRIG 76.0 01/10/2019   HDL 54.10 01/10/2019   LDLCALC 127 (H) 01/10/2019   ALT 20 12/27/2019   AST 18 12/27/2019   NA 140 12/27/2019   K 3.8 12/27/2019   CL 104 12/27/2019   CREATININE 1.07 12/27/2019   BUN 14 12/27/2019   CO2 25 12/27/2019   TSH 1.47 01/10/2019   PSA 0.53 01/10/2019   HGBA1C 5.3 01/05/2018      Assessment & Plan:

## 2020-01-04 NOTE — Patient Instructions (Addendum)
Please continue all other medications as before, and refills have been done if requested.  Please have the pharmacy call with any other refills you may need.  Please continue your efforts at being more active, low cholesterol diet, and weight control.  Please keep your appointments with your specialists as you may have planned  You will be contacted regarding the referral for: colonoscopy  OK to cancel the Feb 2021 appt  Please return in 6 months, or sooner if needed

## 2020-01-18 ENCOUNTER — Other Ambulatory Visit: Payer: Self-pay | Admitting: Internal Medicine

## 2020-01-18 DIAGNOSIS — K5792 Diverticulitis of intestine, part unspecified, without perforation or abscess without bleeding: Secondary | ICD-10-CM

## 2020-02-06 ENCOUNTER — Ambulatory Visit: Payer: 59 | Admitting: Internal Medicine

## 2020-05-04 ENCOUNTER — Telehealth: Payer: Self-pay | Admitting: Internal Medicine

## 2020-05-04 NOTE — Telephone Encounter (Signed)
   Patient calling seeking advice for possible diverticulitis flare up. He is experiencing stomach pain, nausea, loose stool. Call patient [phone] Seeking advice only, no appointment made at this time

## 2020-05-04 NOTE — Telephone Encounter (Signed)
New message:   Pt is calling and states he has been getting a little better from his call earlier to [place] but would still like a call back just in case it starts to flare up again. Please advise.

## 2020-07-09 ENCOUNTER — Encounter: Payer: Self-pay | Admitting: Internal Medicine

## 2020-07-09 ENCOUNTER — Ambulatory Visit: Payer: 59 | Admitting: Internal Medicine

## 2020-07-09 ENCOUNTER — Other Ambulatory Visit: Payer: Self-pay

## 2020-07-09 VITALS — BP 150/100 | HR 79 | Temp 99.1°F | Ht 68.0 in | Wt 209.0 lb

## 2020-07-09 DIAGNOSIS — E785 Hyperlipidemia, unspecified: Secondary | ICD-10-CM

## 2020-07-09 DIAGNOSIS — Z0000 Encounter for general adult medical examination without abnormal findings: Secondary | ICD-10-CM

## 2020-07-09 DIAGNOSIS — K5792 Diverticulitis of intestine, part unspecified, without perforation or abscess without bleeding: Secondary | ICD-10-CM | POA: Diagnosis not present

## 2020-07-09 DIAGNOSIS — R011 Cardiac murmur, unspecified: Secondary | ICD-10-CM | POA: Diagnosis not present

## 2020-07-09 DIAGNOSIS — I10 Essential (primary) hypertension: Secondary | ICD-10-CM | POA: Diagnosis not present

## 2020-07-09 DIAGNOSIS — E538 Deficiency of other specified B group vitamins: Secondary | ICD-10-CM

## 2020-07-09 DIAGNOSIS — E559 Vitamin D deficiency, unspecified: Secondary | ICD-10-CM

## 2020-07-09 NOTE — Assessment & Plan Note (Signed)
stable overall by history and exam, recent data reviewed with pt, and pt to continue medical treatment as before,  to f/u any worsening symptoms or concerns  

## 2020-07-09 NOTE — Progress Notes (Signed)
Subjective:    Patient ID: Julian Swanson, male    DOB: 10/13/1976, 44 y.o.   MRN: [id number]  HPI  Here to f/u; overall doing ok,  Pt denies chest pain, increasing sob or doe, wheezing, orthopnea, PND, increased LE swelling, palpitations, dizziness or syncope.  Pt denies new neurological symptoms such as new headache, or facial or extremity weakness or numbness.  Pt denies polydipsia, polyuria, or low sugar episode.  Pt states overall good compliance with meds, mostly trying to follow appropriate diet, with wt overall stable,  but little exercise however. Has lost wt with bettter diet.   Wt Readings from Last 3 Encounters:  07/09/20 209 lb (94.8 kg)  01/04/20 213 lb (96.6 kg)  10/31/19 215 lb (97.5 kg)  Abd pain improved, Declines colonoscopy since had recent cologuard per Dr Matthias Hughs.   Pt denies fever, wt loss, night sweats, loss of appetite, or other constitutional symptoms  BP at home < 140/90 Past Medical History:  Diagnosis Date   Allergic rhinitis 10/31/2019   HLD (hyperlipidemia) 10/31/2019   Hypertension    History reviewed. No pertinent surgical history.  reports that he has never smoked. He has never used smokeless tobacco. He reports that he does not drink alcohol and does not use drugs. family history includes Diabetes in an other family member; Hypertension in his father, mother, and another family member. Allergies  Allergen Reactions   Amoxicillin-Pot Clavulanate     Rash on thighs   Penicillins     See Augmentin reaction   Sulfa Antibiotics     05/19/14 urticaria   Prednisone     No definitive reaction 05/19/14 known reaction denied   Current Outpatient Medications on File Prior to Visit  Medication Sig Dispense Refill   Aluminum & Magnesium Hydroxide (MAGNESIUM-ALUMINUM PO) Take 1 tablet by mouth daily.      Ascorbic Acid (VITAMIN C) 100 MG tablet Take 100 mg by mouth daily.     Cholecalciferol (D3 ADULT PO) Take 1 tablet by mouth daily.      KRILL  OIL PO Take 1 tablet by mouth daily.      metoprolol succinate (TOPROL-XL) 25 MG 24 hr tablet Take 1 tablet (25 mg total) by mouth daily. 90 tablet 3   Zinc Sulfate (ZINC 15 PO) Take 1 tablet by mouth daily.      No current facility-administered medications on file prior to visit.   Review of Systems All otherwise neg per pt    Objective:   Physical Exam BP (!) 150/100 (BP Location: Left Arm, Patient Position: Sitting, Cuff Size: Large)    Pulse 79    Temp 99.1 F (37.3 C) (Oral)    Ht 5\' 8"  (1.727 m)    Wt 209 lb (94.8 kg)    SpO2 98%    BMI 31.78 kg/m  VS noted,  Constitutional: Pt appears in NAD HENT: Head: NCAT.  Right Ear: External ear normal.  Left Ear: External ear normal.  Eyes: . Pupils are equal, round, and reactive to light. Conjunctivae and EOM are normal Nose: without d/c or deformity Neck: Neck supple. Gross normal ROM Cardiovascular: Normal rate and regular rhythm.  gr 2/6 RUSB murmur - chronic Pulmonary/Chest: Effort normal and breath sounds without rales or wheezing.  Abd:  Soft, NT, ND, + BS, no organomegaly Neurological: Pt is alert. At baseline orientation, motor grossly intact Skin: Skin is warm. No rashes, other new lesions, no LE edema Psychiatric: Pt behavior is normal without agitation  All otherwise neg per pt Lab Results  Component Value Date   WBC 7.9 12/27/2019   HGB 16.4 12/27/2019   HCT 46.6 12/27/2019   PLT 202 12/27/2019   GLUCOSE 87 12/27/2019   CHOL 196 01/10/2019   TRIG 76.0 01/10/2019   HDL 54.10 01/10/2019   LDLCALC 127 (H) 01/10/2019   ALT 20 12/27/2019   AST 18 12/27/2019   NA 140 12/27/2019   K 3.8 12/27/2019   CL 104 12/27/2019   CREATININE 1.07 12/27/2019   BUN 14 12/27/2019   CO2 25 12/27/2019   TSH 1.47 01/10/2019   PSA 0.53 01/10/2019   HGBA1C 5.3 01/05/2018          Assessment & Plan:

## 2020-07-09 NOTE — Assessment & Plan Note (Addendum)
Resolved,  to f/u any worsening symptoms or concerns  I spent 31 minutes in preparing to see the patient by review of recent labs, imaging and procedures, obtaining and reviewing separately obtained history, communicating with the patient and family or caregiver, ordering medications, tests or procedures, and documenting clinical information in the EHR including the differential Dx, treatment, and any further evaluation and other management of acute diverticulitis, htn, heart murmur, hld

## 2020-07-09 NOTE — Patient Instructions (Signed)
Your BP was checked with our nurse and your machine today and were similar  I think you may not need further medication at this time  We can hold on further referral for colonoscopy for now  Let me know if you would every want the Echocardiogram to check the heart and the murmur  Please continue all other medications as before, and refills have been done if requested.  Please have the pharmacy call with any other refills you may need.  Please continue your efforts at being more active, low cholesterol diet, and weight control.  Please keep your appointments with your specialists as you may have planned  Please make an Appointment to return in 6 months, or sooner if needed, also with Lab Appointment for testing done 3-5 days before at the FIRST FLOOR Lab (so this is for TWO appointments - please see the scheduling desk as you leave)

## 2020-07-09 NOTE — Assessment & Plan Note (Signed)
Chronic stable,  to f/u any worsening symptoms or concerns 

## 2020-08-10 ENCOUNTER — Telehealth: Payer: Self-pay | Admitting: Internal Medicine

## 2020-08-10 NOTE — Telephone Encounter (Signed)
Rec'd from [facility] forwarded 2 pages to Oliver Barre MD

## 2021-01-03 ENCOUNTER — Other Ambulatory Visit (INDEPENDENT_AMBULATORY_CARE_PROVIDER_SITE_OTHER): Payer: 59

## 2021-01-03 DIAGNOSIS — E538 Deficiency of other specified B group vitamins: Secondary | ICD-10-CM | POA: Diagnosis not present

## 2021-01-03 DIAGNOSIS — E559 Vitamin D deficiency, unspecified: Secondary | ICD-10-CM | POA: Diagnosis not present

## 2021-01-03 DIAGNOSIS — Z0000 Encounter for general adult medical examination without abnormal findings: Secondary | ICD-10-CM | POA: Diagnosis not present

## 2021-01-03 LAB — HEPATIC FUNCTION PANEL
ALT: 22 U/L (ref 0–53)
AST: 22 U/L (ref 0–37)
Albumin: 4.5 g/dL (ref 3.5–5.2)
Alkaline Phosphatase: 53 U/L (ref 39–117)
Bilirubin, Direct: 0.2 mg/dL (ref 0.0–0.3)
Total Bilirubin: 1.1 mg/dL (ref 0.2–1.2)
Total Protein: 6.9 g/dL (ref 6.0–8.3)

## 2021-01-03 LAB — URINALYSIS, ROUTINE W REFLEX MICROSCOPIC
Bilirubin Urine: NEGATIVE
Hgb urine dipstick: NEGATIVE
Ketones, ur: NEGATIVE
Leukocytes,Ua: NEGATIVE
Nitrite: NEGATIVE
RBC / HPF: NONE SEEN (ref 0–?)
Specific Gravity, Urine: 1.015 (ref 1.000–1.030)
Total Protein, Urine: NEGATIVE
Urine Glucose: NEGATIVE
Urobilinogen, UA: 0.2 (ref 0.0–1.0)
WBC, UA: NONE SEEN (ref 0–?)
pH: 7.5 (ref 5.0–8.0)

## 2021-01-03 LAB — LIPID PANEL
Cholesterol: 173 mg/dL (ref 0–200)
HDL: 54.4 mg/dL (ref >39.00)
LDL Cholesterol: 106 mg/dL — ABNORMAL HIGH (ref 0–99)
NonHDL: 118.91
Total CHOL/HDL Ratio: 3
Triglycerides: 63 mg/dL (ref 0.0–149.0)
VLDL: 12.6 mg/dL (ref 0.0–40.0)

## 2021-01-03 LAB — CBC WITH DIFFERENTIAL/PLATELET
Basophils Absolute: 0.1 10*3/uL (ref 0.0–0.1)
Basophils Relative: 1.3 % (ref 0.0–3.0)
Eosinophils Absolute: 0.1 10*3/uL (ref 0.0–0.7)
Eosinophils Relative: 1.7 % (ref 0.0–5.0)
HCT: 46.3 % (ref 39.0–52.0)
Hemoglobin: 15.7 g/dL (ref 13.0–17.0)
Lymphocytes Relative: 18.9 % (ref 12.0–46.0)
Lymphs Abs: 1 10*3/uL (ref 0.7–4.0)
MCHC: 33.9 g/dL (ref 30.0–36.0)
MCV: 93.4 fl (ref 78.0–100.0)
Monocytes Absolute: 0.8 10*3/uL (ref 0.1–1.0)
Monocytes Relative: 15.6 % — ABNORMAL HIGH (ref 3.0–12.0)
Neutro Abs: 3.2 10*3/uL (ref 1.4–7.7)
Neutrophils Relative %: 62.5 % (ref 43.0–77.0)
Platelets: 157 10*3/uL (ref 150.0–400.0)
RBC: 4.96 Mil/uL (ref 4.22–5.81)
RDW: 13.1 % (ref 11.5–15.5)
WBC: 5.1 10*3/uL (ref 4.0–10.5)

## 2021-01-03 LAB — VITAMIN B12: Vitamin B-12: 94 pg/mL — ABNORMAL LOW (ref 211–911)

## 2021-01-03 LAB — BASIC METABOLIC PANEL
BUN: 13 mg/dL (ref 6–23)
CO2: 29 mEq/L (ref 19–32)
Calcium: 9.9 mg/dL (ref 8.4–10.5)
Chloride: 106 mEq/L (ref 96–112)
Creatinine, Ser: 1.18 mg/dL (ref 0.40–1.50)
GFR: 73.97 mL/min (ref >60.00)
Glucose, Bld: 105 mg/dL — ABNORMAL HIGH (ref 70–99)
Potassium: 4.6 mEq/L (ref 3.5–5.1)
Sodium: 139 mEq/L (ref 135–145)

## 2021-01-03 LAB — VITAMIN D 25 HYDROXY (VIT D DEFICIENCY, FRACTURES): VITD: 50.41 ng/mL (ref 30.00–100.00)

## 2021-01-03 LAB — TSH: TSH: 1.1 u[IU]/mL (ref 0.35–4.50)

## 2021-01-03 LAB — PSA: PSA: 0.49 ng/mL (ref 0.10–4.00)

## 2021-01-03 NOTE — Addendum Note (Signed)
Addended by: Vanessa Kampf C on: 01/03/2021 07:35 AM   Modules accepted: Orders  

## 2021-01-03 NOTE — Addendum Note (Signed)
Addended by: Sherissa Tenenbaum C on: 01/03/2021 07:36 AM   Modules accepted: Orders  

## 2021-01-03 NOTE — Addendum Note (Signed)
Addended by: Ilda Foil on: 01/03/2021 07:35 AM   Modules accepted: Orders

## 2021-01-03 NOTE — Addendum Note (Signed)
Addended by: Alquan Morrish C on: 01/03/2021 07:36 AM   Modules accepted: Orders  

## 2021-01-03 NOTE — Addendum Note (Signed)
Addended by: Aerica Rincon C on: 01/03/2021 07:36 AM   Modules accepted: Orders  

## 2021-01-03 NOTE — Addendum Note (Signed)
Addended by: Cejay Cambre C on: 01/03/2021 07:36 AM   Modules accepted: Orders  

## 2021-01-03 NOTE — Addendum Note (Signed)
Addended by: Yaslyn Cumby C on: 01/03/2021 07:35 AM   Modules accepted: Orders  

## 2021-01-03 NOTE — Addendum Note (Signed)
Addended by: Ilda Foil on: 01/03/2021 07:36 AM   Modules accepted: Orders

## 2021-01-03 NOTE — Addendum Note (Signed)
Addended by: Vanissa Strength C on: 01/03/2021 07:35 AM   Modules accepted: Orders  

## 2021-01-07 ENCOUNTER — Encounter: Payer: Self-pay | Admitting: Internal Medicine

## 2021-01-07 ENCOUNTER — Ambulatory Visit: Payer: 59 | Admitting: Internal Medicine

## 2021-01-07 ENCOUNTER — Telehealth (INDEPENDENT_AMBULATORY_CARE_PROVIDER_SITE_OTHER): Payer: 59 | Admitting: Internal Medicine

## 2021-01-07 VITALS — BP 130/85

## 2021-01-07 DIAGNOSIS — I10 Essential (primary) hypertension: Secondary | ICD-10-CM | POA: Diagnosis not present

## 2021-01-07 DIAGNOSIS — R739 Hyperglycemia, unspecified: Secondary | ICD-10-CM | POA: Diagnosis not present

## 2021-01-07 DIAGNOSIS — E538 Deficiency of other specified B group vitamins: Secondary | ICD-10-CM | POA: Diagnosis not present

## 2021-01-07 DIAGNOSIS — Z1159 Encounter for screening for other viral diseases: Secondary | ICD-10-CM | POA: Diagnosis not present

## 2021-01-07 DIAGNOSIS — E7800 Pure hypercholesterolemia, unspecified: Secondary | ICD-10-CM

## 2021-01-07 DIAGNOSIS — Z0001 Encounter for general adult medical examination with abnormal findings: Secondary | ICD-10-CM

## 2021-01-07 DIAGNOSIS — J069 Acute upper respiratory infection, unspecified: Secondary | ICD-10-CM | POA: Insufficient documentation

## 2021-01-07 DIAGNOSIS — Z114 Encounter for screening for human immunodeficiency virus [HIV]: Secondary | ICD-10-CM

## 2021-01-07 MED ORDER — METOPROLOL SUCCINATE ER 25 MG PO TB24
25.0000 mg | ORAL_TABLET | Freq: Every day | ORAL | 3 refills | Status: DC
Start: 2021-01-07 — End: 2022-01-06

## 2021-01-07 MED ORDER — VITAMIN B-12 1000 MCG PO TABS: 1000.0000 ug | ORAL_TABLET | Freq: Every day | ORAL | 3 refills | Status: AC

## 2021-01-07 NOTE — Assessment & Plan Note (Signed)

## 2021-01-07 NOTE — Assessment & Plan Note (Signed)
Lab Results  Component Value Date   VITAMINB12 94 (L) 01/03/2021   New onset low, start oral replacement - b12 1000 mcg qd, and f/uu b12 level in 3 mo

## 2021-01-07 NOTE — Progress Notes (Signed)
Patient ID: Julian Swanson, male   DOB: 1976/10/12, 45 y.o.   MRN: [id number]  Virtual Visit via Video Note  I connected with Julian Swanson on 01/07/21 at  1:40 PM EST by a video enabled telemedicine application and verified that I am speaking with the correct person using two identifiers.  Location of all participants today Patient: at home Provider: at office   I discussed the limitations of evaluation and management by telemedicine and the availability of in person appointments. The patient expressed understanding and agreed to proceed.       Chief Complaint:: wellness exam and URI symptoms, hyperglycemia, HLD, B12 deficiency, HTN,;        HPI:  Julian Swanson is a 45 y.o. male here for wellness exam, unable to be seen in person due to inclement weather and pandemic;  Pt c/o 3-4 days onset URI symptoms of HA, cough, drainage, but has taste and smell intact, and denies fever, nausea or diarrhea.  Pt is unvaccinated.  No other sick contacts he is aware.  Has not been tested recently.  Overall only very mild ill, works at [employer], today is his day off, but suspects could have been exposed recently at work.    BP at home over the last 5 days have been good - 130/85 on average.  Pt denies chest pain, increased sob or doe, wheezing, orthopnea, PND, increased LE swelling, palpitations, dizziness or syncope.  Pt denies new neurological symptoms such as new headache, or facial or extremity weakness or numbness   Pt denies polydipsia, polyuria, Pt states overall good compliance with meds, trying to follow lower cholesterol diet, wt overall stable but little exercise however.  Wt Readings from Last 3 Encounters:  07/09/20 209 lb (94.8 kg)  01/04/20 213 lb (96.6 kg)  10/31/19 215 lb (97.5 kg)   BP Readings from Last 3 Encounters:  01/07/21 130/85  07/09/20 (!) 150/100  01/04/20 (!) 140/100     Past Medical History:  Diagnosis Date  . Allergic rhinitis 10/31/2019  . HLD  (hyperlipidemia) 10/31/2019  . Hypertension    No past surgical history on file.  reports that he has never smoked. He has never used smokeless tobacco. He reports that he does not drink alcohol and does not use drugs. family history includes Diabetes in an other family member; Hypertension in his father, mother, and another family member. Allergies  Allergen Reactions  . Amoxicillin-Pot Clavulanate     Rash on thighs  . Penicillins     See Augmentin reaction  . Sulfa Antibiotics     05/19/14 urticaria  . Prednisone     No definitive reaction 05/19/14 known reaction denied   Current Outpatient Medications on File Prior to Visit  Medication Sig Dispense Refill  . Aluminum & Magnesium Hydroxide (MAGNESIUM-ALUMINUM PO) Take 1 tablet by mouth daily.     . Ascorbic Acid (VITAMIN C) 100 MG tablet Take 100 mg by mouth daily.    . Cholecalciferol (D3 ADULT PO) Take 1 tablet by mouth daily.     Marland Kitchen KRILL OIL PO Take 1 tablet by mouth daily.     . metoprolol succinate (TOPROL-XL) 25 MG 24 hr tablet Take 1 tablet (25 mg total) by mouth daily. 90 tablet 3  . Zinc Sulfate (ZINC 15 PO) Take 1 tablet by mouth daily.      No current facility-administered medications on file prior to visit.        ROS:  All others reviewed  and negative.  Objective        PE:  BP 130/85               Alert, NAD, appropriate mood and affect, resps normal, cn 2-12 intact, moves all 4s, no visible rash or swelling  Assessment/Plan:  Julian Swanson is a 45 y.o. White or Caucasian [1] male with  has a past medical history of Allergic rhinitis (10/31/2019), HLD (hyperlipidemia) (10/31/2019), and Hypertension.  Assessment Plan  See notes Labs/data reviewed for each problem: Lab Results  Component Value Date   WBC 5.1 01/03/2021   HGB 15.7 01/03/2021   HCT 46.3 01/03/2021   PLT 157.0 01/03/2021   GLUCOSE 105 (H) 01/03/2021   CHOL 173 01/03/2021   TRIG 63.0 01/03/2021   HDL 54.40 01/03/2021   LDLCALC 106 (H)  01/03/2021   ALT 22 01/03/2021   AST 22 01/03/2021   NA 139 01/03/2021   K 4.6 01/03/2021   CL 106 01/03/2021   CREATININE 1.18 01/03/2021   BUN 13 01/03/2021   CO2 29 01/03/2021   TSH 1.10 01/03/2021   PSA 0.49 01/03/2021   HGBA1C 5.3 01/05/2018   Micro: none  Cardiac tracings I have personally interpreted today:  none  Pertinent Radiological findings (summarize): none    Follow Up Instructions:  See notes    I discussed the assessment and treatment plan with the patient. The patient was provided an opportunity to ask questions and all were answered. The patient agreed with the plan and demonstrated an understanding of the instructions.   The patient was advised to call back or seek an in-person evaluation if the symptoms worsen or if the condition fails to improve as anticipated.  Oliver Barre, MD 01/07/2021 2:01 PM [facility]

## 2021-01-07 NOTE — Assessment & Plan Note (Signed)
Lab Results  Component Value Date   LDLCALC 106 (H) 01/03/2021   Stable to improved over last yr, pt to continue current low chol diet, declines statin   Current Outpatient Medications (Cardiovascular):  .  metoprolol succinate (TOPROL-XL) 25 MG 24 hr tablet, Take 1 tablet (25 mg total) by mouth daily.     Current Outpatient Medications (Other):  Marland Kitchen  Aluminum & Magnesium Hydroxide (MAGNESIUM-ALUMINUM PO), Take 1 tablet by mouth daily.  .  Ascorbic Acid (VITAMIN C) 100 MG tablet, Take 100 mg by mouth daily. .  Cholecalciferol (D3 ADULT PO), Take 1 tablet by mouth daily.  Marland Kitchen  KRILL OIL PO, Take 1 tablet by mouth daily.  .  Zinc Sulfate (ZINC 15 PO), Take 1 tablet by mouth daily.

## 2021-01-07 NOTE — Assessment & Plan Note (Signed)
Lab Results  Component Value Date   HGBA1C 5.3 01/05/2018   Stable, pt to continue current medical treatment - no OHA, and f/u a1c at 3 mo   Current Outpatient Medications (Cardiovascular):  .  metoprolol succinate (TOPROL-XL) 25 MG 24 hr tablet, Take 1 tablet (25 mg total) by mouth daily.     Current Outpatient Medications (Other):  Marland Kitchen  Aluminum & Magnesium Hydroxide (MAGNESIUM-ALUMINUM PO), Take 1 tablet by mouth daily.  .  Ascorbic Acid (VITAMIN C) 100 MG tablet, Take 100 mg by mouth daily. .  Cholecalciferol (D3 ADULT PO), Take 1 tablet by mouth daily.  Marland Kitchen  KRILL OIL PO, Take 1 tablet by mouth daily.  .  Zinc Sulfate (ZINC 15 PO), Take 1 tablet by mouth daily.

## 2021-01-07 NOTE — Patient Instructions (Addendum)
Please go to any local pharmacy with an appointment to be covid tested, and consider COVID vaccination at 90 days if you are positive  If you are positive, please also let us know, but also take OTC Vit C, Vit D, and Zinc  Please take all new medication as prescribed - the B12 1000 mcg per day  Please continue all other medications as before, and refills have been done if requested.  Please have the pharmacy call with any other refills you may need.  Please continue your efforts at being more active, low cholesterol diet, and weight control.  You are otherwise up to date with prevention measures today.  Please keep your appointments with your specialists as you may have planned  Please go to the LAB at the blood drawing area for the tests to be done at the [facility] lab in 3 months to check on the B12 improving; if it is not, you may need to start shots  You will be contacted by phone if any changes need to be made immediately.  Otherwise, you will receive a letter about your results with an explanation, but please check with MyChart first.  Please remember to sign up for MyChart if you have not done so, as this will be important to you in the future with finding out test results, communicating by private email, and scheduling acute appointments online when needed.  Please make an Appointment to return for your 1 year visit, or sooner if needed, with Lab testing by Appointment as well, to be done about 3-5 days before at the FIRST FLOOR Lab (so this is for TWO appointments with the first being for the LAB at [facility])  Due to the ongoing Covid 19 pandemic, our lab now requires an appointment for any labs done at our office.  If you need labs done and do not have an appointment, please call our office ahead of time to schedule before presenting to the lab for your testing.

## 2021-01-07 NOTE — Assessment & Plan Note (Signed)
BP Readings from Last 3 Encounters:  01/07/21 130/85  07/09/20 (!) 150/100  01/04/20 (!) 140/100   Stable, pt to continue medical treatment  - toprol 25   Current Outpatient Medications (Cardiovascular):  .  metoprolol succinate (TOPROL-XL) 25 MG 24 hr tablet, Take 1 tablet (25 mg total) by mouth daily.     Current Outpatient Medications (Other):  Marland Kitchen  Aluminum & Magnesium Hydroxide (MAGNESIUM-ALUMINUM PO), Take 1 tablet by mouth daily.  .  Ascorbic Acid (VITAMIN C) 100 MG tablet, Take 100 mg by mouth daily. .  Cholecalciferol (D3 ADULT PO), Take 1 tablet by mouth daily.  Marland Kitchen  KRILL OIL PO, Take 1 tablet by mouth daily.  .  Zinc Sulfate (ZINC 15 PO), Take 1 tablet by mouth daily.

## 2021-01-07 NOTE — Assessment & Plan Note (Signed)
Very mild, viral by exam most likely, and urged pt to get covid tested so at the least he would know and try not to give to family, as well as consider covid vaccine at 90 days

## 2021-05-22 ENCOUNTER — Other Ambulatory Visit: Payer: Self-pay | Admitting: Internal Medicine

## 2021-05-22 DIAGNOSIS — E559 Vitamin D deficiency, unspecified: Secondary | ICD-10-CM

## 2021-05-22 DIAGNOSIS — E7800 Pure hypercholesterolemia, unspecified: Secondary | ICD-10-CM

## 2021-05-22 DIAGNOSIS — R739 Hyperglycemia, unspecified: Secondary | ICD-10-CM

## 2021-05-22 DIAGNOSIS — Z0000 Encounter for general adult medical examination without abnormal findings: Secondary | ICD-10-CM

## 2021-05-22 DIAGNOSIS — E538 Deficiency of other specified B group vitamins: Secondary | ICD-10-CM

## 2021-05-29 IMAGING — CT CT ABD-PELV W/ CM
1 of 3 series · 12 of 32 positions shown, 17 images · IV contrast (APPLIED)
Comparison: None.

CLINICAL DATA: Left lower quadrant pain and fever

EXAM:
CT ABDOMEN AND PELVIS WITH CONTRAST
TECHNIQUE: Multidetector CT imaging of the abdomen and pelvis was performed
using the standard protocol following bolus administration of
intravenous contrast.
CONTRAST:  100mL 9ZMY1L-L44 IOPAMIDOL (9ZMY1L-L44) INJECTION 61%
Creatinine 1.2; GFR 73

[Series 2: abd/pelvis w/cm · axial · 0.84mm/px · z∈[+563,+993]mm · 12 of 102 slices shown, 17 images]
[im 8/102  soft-tissue]
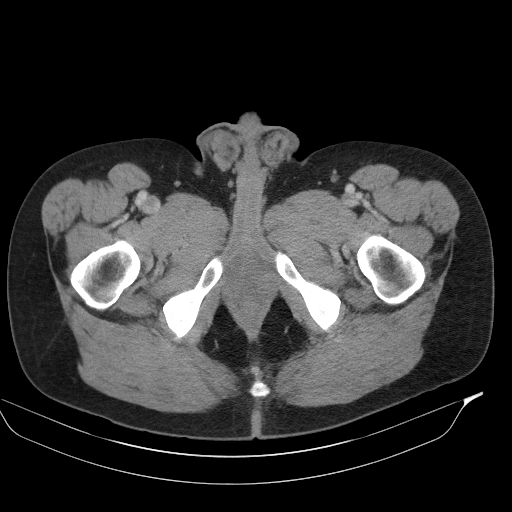
[im 8/102  bone]
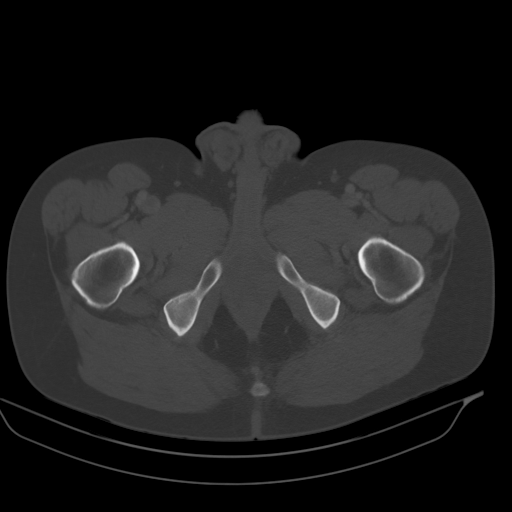
[im 15/102  soft-tissue]
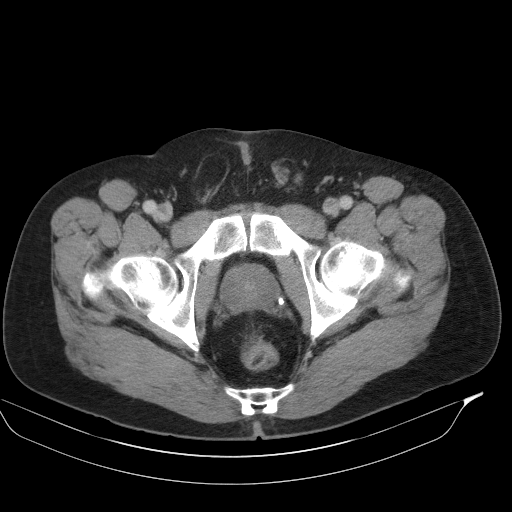
[im 22/102  soft-tissue]
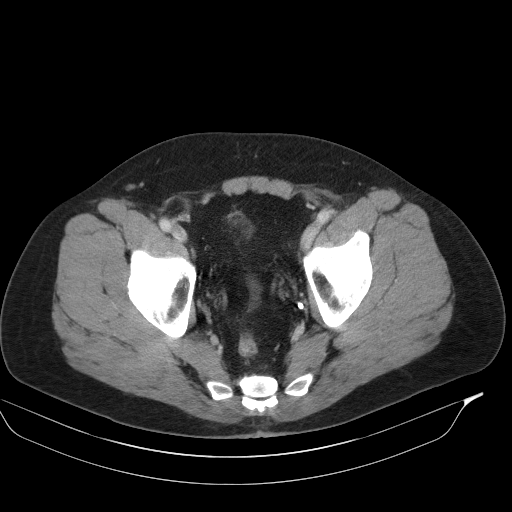
[im 37/102  soft-tissue]
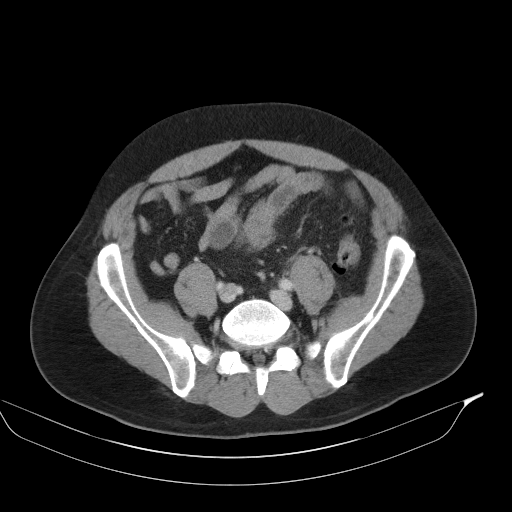
[im 44/102  soft-tissue]
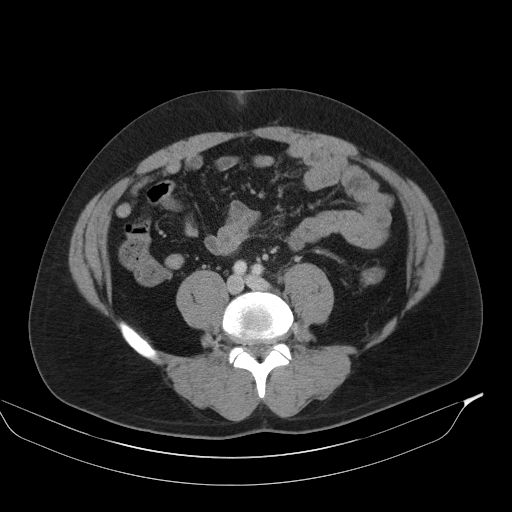
[im 51/102  soft-tissue]
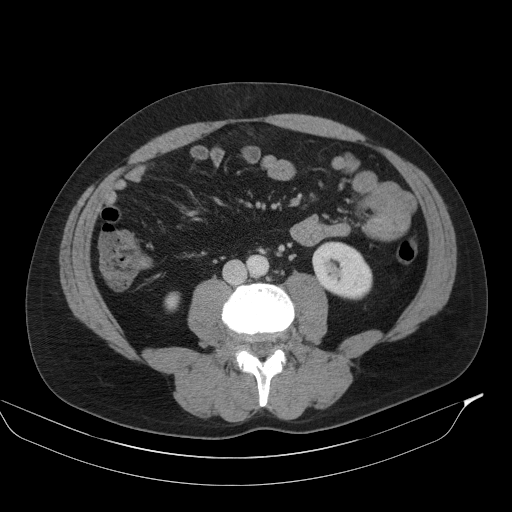
[im 58/102  soft-tissue]
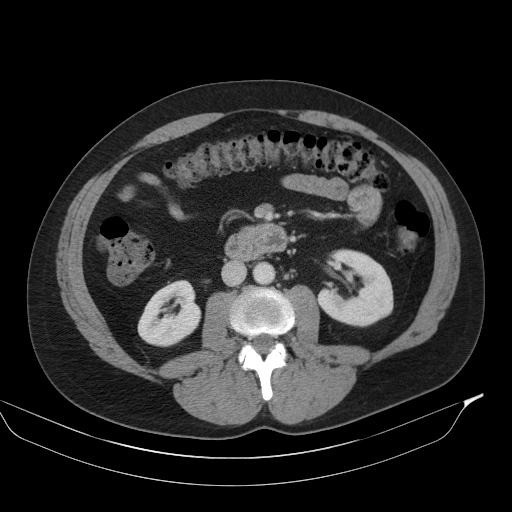
[im 65/102  soft-tissue]
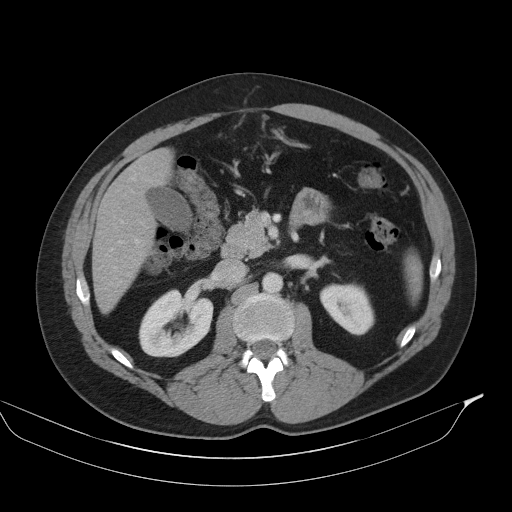
[im 73/102  lung]
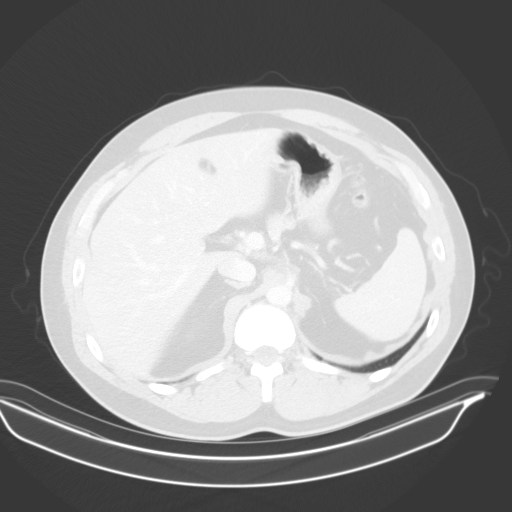
[im 80/102  soft-tissue]
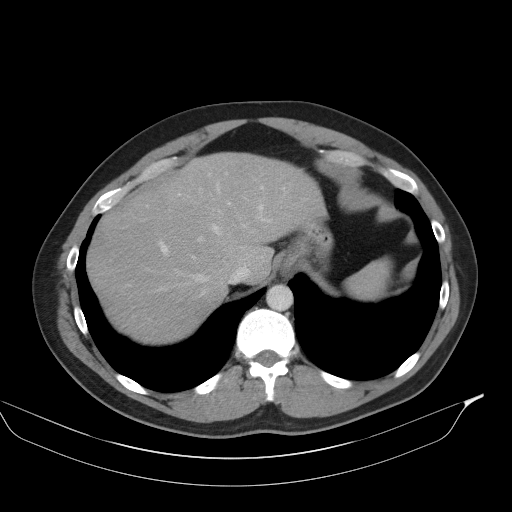
[im 80/102  lung]
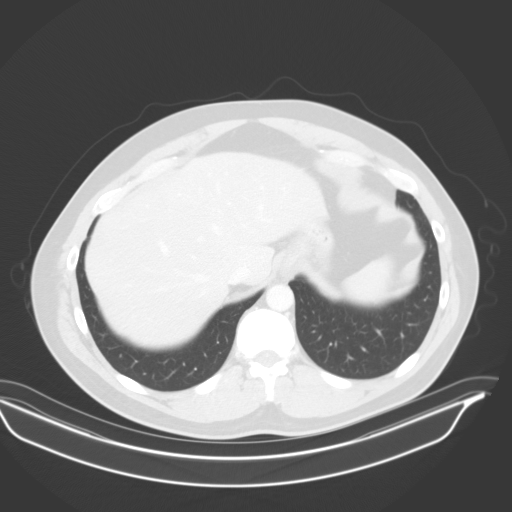
[im 80/102  bone]
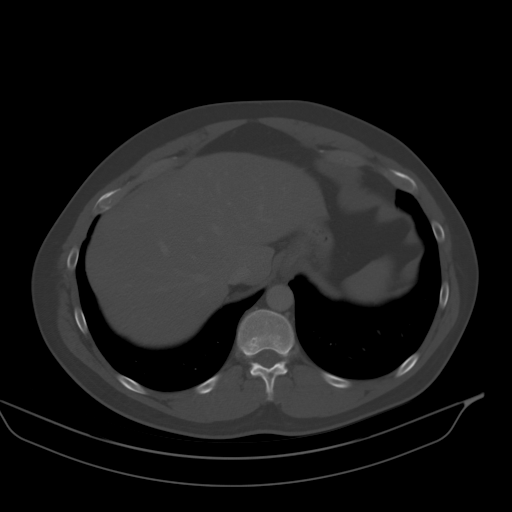
[im 87/102  soft-tissue]
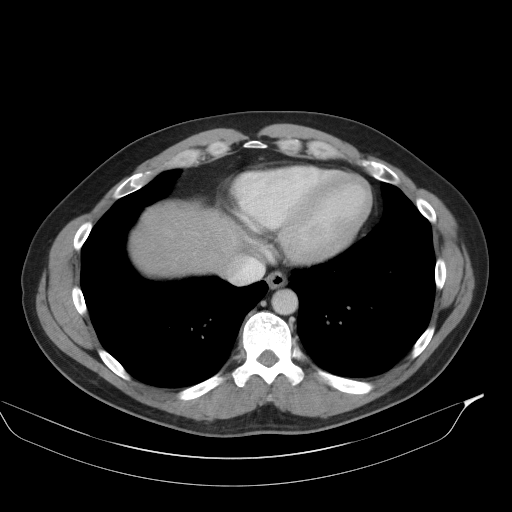
[im 87/102  lung]
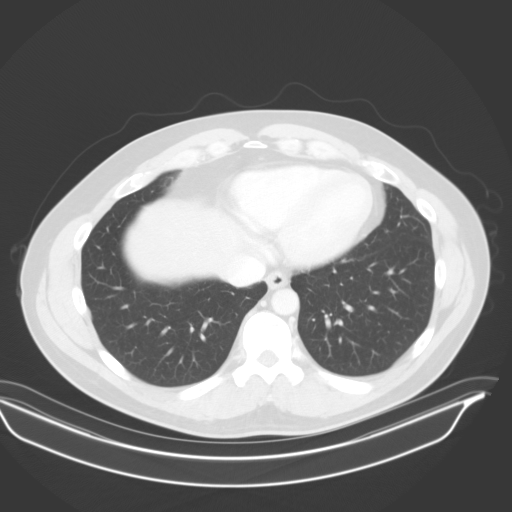
[im 94/102  soft-tissue]
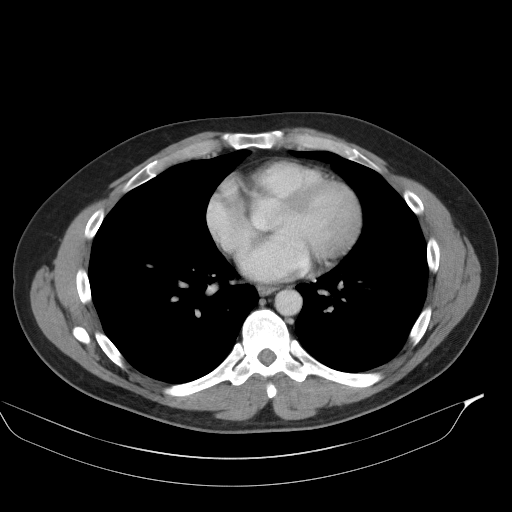
[im 94/102  lung]
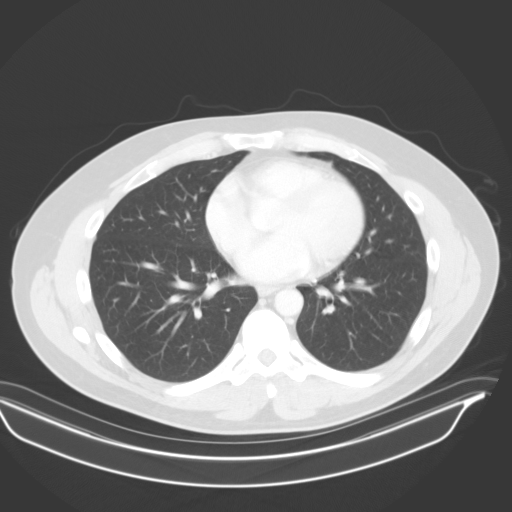

[12 of 32 positions shown; findings below may reference images not displayed]

FINDINGS: Lower chest: Lung bases clear.

Hepatobiliary: No focal liver lesions are evident. The gallbladder
wall is not appreciably thickened. There is no biliary duct
dilatation.

Pancreas: There is no pancreatic mass or inflammatory focus.

Spleen: No splenic lesions are evident.

Adrenals/Urinary Tract: Adrenals bilaterally appear unremarkable.
Kidneys bilaterally show no evident mass or hydronephrosis on either
side. There is no renal or ureteral calculus on either side. Urinary
bladder is midline with wall thickness within normal limits.

Stomach/Bowel: There is wall thickening and surrounding mesenteric
thickening in the proximal sigmoid colon region. There are several
irregular diverticula in this area. No abscess or focal fluid is
seen in this area of diverticulitis. Microperforation is noted along
the posterior leftward aspect of this area of proximal sigmoid
diverticulitis.

Elsewhere, there are occasional descending colonic diverticula
without inflammation. No evident bowel obstruction. The terminal
ileum appears normal. There is no evident portal venous air. There
is no pneumoperitoneum beyond the micro-perforation at the site of
the focal diverticulitis.

Vascular/Lymphatic: There is no abdominal aortic aneurysm. No
vascular lesions are evident. Note that the major venous structures
appear patent. There is no demonstrable adenopathy in the abdomen or
pelvis.

Reproductive: Prostate and seminal vesicles are normal in size and
contour. There is no evident pelvic mass.

Other: The appendix appears normal. No evident abscess or ascites in
the abdomen or pelvis.

There is a focal umbilical hernia containing fat but no bowel. The
neck of this umbilical hernia measures 1.8 cm from right to left
dimension and 1.3 cm from superior to inferior dimension. There is a
second midline ventral hernia located 7.5 cm superior to the
umbilicus containing only fat. This hernia at its neck measures
cm from right to left dimension and 1.2 cm from superior to inferior
dimension. There are fat containing umbilical hernias as well. No
bowel containing hernias are evident.

Musculoskeletal: There is a left-sided pars defect on the left.
There is disc space narrowing at L3-4 with 6 mm of anterolisthesis
of L3 on L4. No blastic or lytic bone lesions. No intramuscular
lesions are evident.
IMPRESSION: 1. CT findings are consistent with acute diverticulitis of the
proximal sigmoid colon. Microperforation is noted along the
posterior leftward aspect of this area of diverticulitis. No abscess
or focal fluid is seen in this area of diverticulitis.
2. There are 2 midline ventral hernias containing fat but no bowel.
There are fat containing inguinal hernias bilaterally.
3. Left-sided pars defect at L4 with 6 mm of anterolisthesis of L3
on L4.
4. No bowel obstruction. No abscess in the abdomen or pelvis.
Appendix appears normal. No pneumoperitoneum beyond the
microperforation noted in the region of the diverticulitis.

These results will be called to the ordering clinician or
representative by the Radiologist Assistant, and communication
documented in the PACS or zVision Dashboard.

## 2021-07-23 DIAGNOSIS — M25521 Pain in right elbow: Secondary | ICD-10-CM | POA: Diagnosis not present

## 2021-07-23 DIAGNOSIS — M25522 Pain in left elbow: Secondary | ICD-10-CM | POA: Diagnosis not present

## 2021-09-02 ENCOUNTER — Telehealth (INDEPENDENT_AMBULATORY_CARE_PROVIDER_SITE_OTHER): Payer: 59 | Admitting: Family Medicine

## 2021-09-02 ENCOUNTER — Encounter: Payer: Self-pay | Admitting: Family Medicine

## 2021-09-02 VITALS — Ht 68.0 in | Wt 208.0 lb

## 2021-09-02 DIAGNOSIS — R0789 Other chest pain: Secondary | ICD-10-CM

## 2021-09-02 NOTE — Progress Notes (Signed)
Established Patient Office Visit  Subjective:  Patient ID: Julian Swanson, male    DOB: 09-19-1976  Age: 45 y.o. MRN: [id number]  CC:  Chief Complaint  Patient presents with   Gastroesophageal Reflux    Possible reflux issues lots of burping and stomach not feeling at ease.     HPI Julian Swanson presents for evaluation of an acute episode of midsternal chest pain.  Denies that it was associated with shortness of breath nausea or diaphoresis.  It did happen when he laid down to go to bed last night.  He stood up and there was some relief with burping.  There was some increase in flatulence.  He has rare episodes of reflux.  He rarely awakes with a brackish taste in the back of his throat.  He does not smoke or use illicit drugs.  Lipid profile with mildly elevated LDL cholesterol and favorable HDL cholesterol.  No family history of heart disease.  Denies cough or hemoptysis.  No recent fevers or chills.  He has never taken anything for reflux disease.  He is nervous because this is chest pain.  There has been no further chest pain today.  History of lateral epicondylitis that he is taking as needed low-dose ibuprofen to treat.  Past Medical History:  Diagnosis Date   Allergic rhinitis 10/31/2019   HLD (hyperlipidemia) 10/31/2019   Hypertension     History reviewed. No pertinent surgical history.  Family History  Problem Relation Age of Onset   Diabetes Other        relation not spec.   Hypertension Other        relation not spec.   Hypertension Mother    Hypertension Father     Social History   Socioeconomic History   Marital status: Married    Spouse name: Not on file   Number of children: 2   Years of education: 16   Highest education level: Not on file  Occupational History   Occupation: [employer]  Tobacco Use   Smoking status: Never   Smokeless tobacco: Never  Vaping Use   Vaping Use: Never used  Substance and Sexual Activity   Alcohol use: No   Drug use: No    Sexual activity: Not on file  Other Topics Concern   Not on file  Social History Narrative   Fun: Basketball, yard work, Youth Group    Social Determinants of Corporate investment banker Strain: Not on Ship broker Insecurity: Not on file  Transportation Needs: Not on file  Physical Activity: Not on file  Stress: Not on file  Social Connections: Not on file  Intimate Partner Violence: Not on file    Outpatient Medications Prior to Visit  Medication Sig Dispense Refill   Aluminum & Magnesium Hydroxide (MAGNESIUM-ALUMINUM PO) Take 1 tablet by mouth daily.      Ascorbic Acid (VITAMIN C) 100 MG tablet Take 100 mg by mouth daily.     Cholecalciferol (D3 ADULT PO) Take 1 tablet by mouth daily.      KRILL OIL PO Take 1 tablet by mouth daily.      metoprolol succinate (TOPROL-XL) 25 MG 24 hr tablet Take 1 tablet (25 mg total) by mouth daily. 90 tablet 3   vitamin B-12 (CYANOCOBALAMIN) 1000 MCG tablet Take 1 tablet (1,000 mcg total) by mouth daily. 90 tablet 3   Zinc Sulfate (ZINC 15 PO) Take 1 tablet by mouth daily.      No facility-administered  medications prior to visit.    Allergies  Allergen Reactions   Amoxicillin-Pot Clavulanate     Rash on thighs   Penicillins     See Augmentin reaction   Sulfa Antibiotics     05/19/14 urticaria   Prednisone     No definitive reaction 05/19/14 known reaction denied    ROS Review of Systems  Eyes:  Negative for photophobia and visual disturbance.  Respiratory:  Negative for cough, chest tightness, shortness of breath and wheezing.   Cardiovascular:  Positive for chest pain. Negative for palpitations and leg swelling.  Gastrointestinal:  Negative for nausea and vomiting.  Genitourinary: Negative.   Psychiatric/Behavioral:  Negative for dysphoric mood. The patient is not nervous/anxious.      Objective:    Physical Exam Constitutional:      Appearance: Normal appearance. He is normal weight.  HENT:     Head: Normocephalic and  atraumatic.     Right Ear: Tympanic membrane normal.     Left Ear: Tympanic membrane normal.  Eyes:     General:        Right eye: No discharge.        Left eye: No discharge.     Extraocular Movements: Extraocular movements intact.     Conjunctiva/sclera: Conjunctivae normal.  Pulmonary:     Effort: Pulmonary effort is normal.  Neurological:     Mental Status: He is alert and oriented to person, place, and time.     Cranial Nerves: No dysarthria or facial asymmetry.  Psychiatric:        Mood and Affect: Mood normal.        Behavior: Behavior normal.    Ht 5\' 8"  (1.727 m)   Wt 208 lb (94.3 kg)   BMI 31.63 kg/m  Wt Readings from Last 3 Encounters:  09/02/21 208 lb (94.3 kg)  07/09/20 209 lb (94.8 kg)  01/04/20 213 lb (96.6 kg)     Health Maintenance Due  Topic Date Due   Hepatitis C Screening  Never done    There are no preventive care reminders to display for this patient.  Lab Results  Component Value Date   TSH 1.10 01/03/2021   Lab Results  Component Value Date   WBC 5.1 01/03/2021   HGB 15.7 01/03/2021   HCT 46.3 01/03/2021   MCV 93.4 01/03/2021   PLT 157.0 01/03/2021   Lab Results  Component Value Date   NA 139 01/03/2021   K 4.6 01/03/2021   CO2 29 01/03/2021   GLUCOSE 105 (H) 01/03/2021   BUN 13 01/03/2021   CREATININE 1.18 01/03/2021   BILITOT 1.1 01/03/2021   ALKPHOS 53 01/03/2021   AST 22 01/03/2021   ALT 22 01/03/2021   PROT 6.9 01/03/2021   ALBUMIN 4.5 01/03/2021   CALCIUM 9.9 01/03/2021   ANIONGAP 11 12/27/2019   GFR 73.97 01/03/2021   Lab Results  Component Value Date   CHOL 173 01/03/2021   Lab Results  Component Value Date   HDL 54.40 01/03/2021   Lab Results  Component Value Date   LDLCALC 106 (H) 01/03/2021   Lab Results  Component Value Date   TRIG 63.0 01/03/2021   Lab Results  Component Value Date   CHOLHDL 3 01/03/2021   Lab Results  Component Value Date   HGBA1C 5.3 01/05/2018      Assessment & Plan:    Problem List Items Addressed This Visit   None Visit Diagnoses     Other chest pain    -  Primary       No orders of the defined types were placed in this encounter.   Follow-up: No follow-ups on file.  Patient is nervous and would like next mediated evaluation.  Recommended that he go to the emergency room for an EKG and enzymes.  Believe his symptoms could be related to GERD.  He has follow-up scheduled with his primary on Wednesday.  Mliss Sax, MD  Virtual Visit via Video Note  I connected with Julian Swanson on 09/02/21 at  3:30 PM EDT by a video enabled telemedicine application and verified that I am speaking with the correct person using two identifiers.  Location: Patient: home alone.  Provider: work   I discussed the limitations of evaluation and management by telemedicine and the availability of in person appointments. The patient expressed understanding and agreed to proceed.  History of Present Illness:    Observations/Objective:   Assessment and Plan:   Follow Up Instructions:    I discussed the assessment and treatment plan with the patient. The patient was provided an opportunity to ask questions and all were answered. The patient agreed with the plan and demonstrated an understanding of the instructions.   The patient was advised to call back or seek an in-person evaluation if the symptoms worsen or if the condition fails to improve as anticipated.  I provided 25 minutes of non-face-to-face time during this encounter.   Mliss Sax, MD

## 2021-09-05 ENCOUNTER — Other Ambulatory Visit: Payer: Self-pay

## 2021-09-05 ENCOUNTER — Other Ambulatory Visit (INDEPENDENT_AMBULATORY_CARE_PROVIDER_SITE_OTHER): Payer: BC Managed Care – PPO

## 2021-09-05 DIAGNOSIS — E538 Deficiency of other specified B group vitamins: Secondary | ICD-10-CM | POA: Diagnosis not present

## 2021-09-05 DIAGNOSIS — Z0000 Encounter for general adult medical examination without abnormal findings: Secondary | ICD-10-CM

## 2021-09-05 DIAGNOSIS — E559 Vitamin D deficiency, unspecified: Secondary | ICD-10-CM | POA: Diagnosis not present

## 2021-09-05 DIAGNOSIS — E7800 Pure hypercholesterolemia, unspecified: Secondary | ICD-10-CM | POA: Diagnosis not present

## 2021-09-05 DIAGNOSIS — Z125 Encounter for screening for malignant neoplasm of prostate: Secondary | ICD-10-CM

## 2021-09-05 DIAGNOSIS — R739 Hyperglycemia, unspecified: Secondary | ICD-10-CM

## 2021-09-05 LAB — CBC WITH DIFFERENTIAL/PLATELET
Basophils Absolute: 0.1 10*3/uL (ref 0.0–0.1)
Basophils Relative: 1.1 % (ref 0.0–3.0)
Eosinophils Absolute: 0.1 10*3/uL (ref 0.0–0.7)
Eosinophils Relative: 1.3 % (ref 0.0–5.0)
HCT: 46 % (ref 39.0–52.0)
Hemoglobin: 15.5 g/dL (ref 13.0–17.0)
Lymphocytes Relative: 31 % (ref 12.0–46.0)
Lymphs Abs: 1.8 10*3/uL (ref 0.7–4.0)
MCHC: 33.7 g/dL (ref 30.0–36.0)
MCV: 93.9 fl (ref 78.0–100.0)
Monocytes Absolute: 0.5 10*3/uL (ref 0.1–1.0)
Monocytes Relative: 9.5 % (ref 3.0–12.0)
Neutro Abs: 3.2 10*3/uL (ref 1.4–7.7)
Neutrophils Relative %: 57.1 % (ref 43.0–77.0)
Platelets: 162 10*3/uL (ref 150.0–400.0)
RBC: 4.89 Mil/uL (ref 4.22–5.81)
RDW: 12.5 % (ref 11.5–15.5)
WBC: 5.7 10*3/uL (ref 4.0–10.5)

## 2021-09-05 LAB — LIPID PANEL
Cholesterol: 199 mg/dL (ref 0–200)
HDL: 50.3 mg/dL (ref >39.00)
LDL Cholesterol: 132 mg/dL — ABNORMAL HIGH (ref 0–99)
NonHDL: 148.49
Total CHOL/HDL Ratio: 4
Triglycerides: 82 mg/dL (ref 0.0–149.0)
VLDL: 16.4 mg/dL (ref 0.0–40.0)

## 2021-09-05 LAB — URINALYSIS, ROUTINE W REFLEX MICROSCOPIC
Bilirubin Urine: NEGATIVE
Ketones, ur: NEGATIVE
Leukocytes,Ua: NEGATIVE
Nitrite: NEGATIVE
Specific Gravity, Urine: 1.01 (ref 1.000–1.030)
Total Protein, Urine: NEGATIVE
Urine Glucose: NEGATIVE
Urobilinogen, UA: 0.2 (ref 0.0–1.0)
pH: 7 (ref 5.0–8.0)

## 2021-09-05 LAB — BASIC METABOLIC PANEL
BUN: 16 mg/dL (ref 6–23)
CO2: 29 mEq/L (ref 19–32)
Calcium: 9.6 mg/dL (ref 8.4–10.5)
Chloride: 103 mEq/L (ref 96–112)
Creatinine, Ser: 1.14 mg/dL (ref 0.40–1.50)
GFR: 76.74 mL/min (ref >60.00)
Glucose, Bld: 93 mg/dL (ref 70–99)
Potassium: 3.9 mEq/L (ref 3.5–5.1)
Sodium: 139 mEq/L (ref 135–145)

## 2021-09-05 LAB — HEMOGLOBIN A1C: Hgb A1c MFr Bld: 5.3 % (ref 4.6–6.5)

## 2021-09-05 LAB — VITAMIN B12: Vitamin B-12: 1443 pg/mL — ABNORMAL HIGH (ref 211–911)

## 2021-09-05 LAB — VITAMIN D 25 HYDROXY (VIT D DEFICIENCY, FRACTURES): VITD: 46.13 ng/mL (ref 30.00–100.00)

## 2021-09-05 LAB — HEPATIC FUNCTION PANEL
ALT: 17 U/L (ref 0–53)
AST: 17 U/L (ref 0–37)
Albumin: 4.5 g/dL (ref 3.5–5.2)
Alkaline Phosphatase: 54 U/L (ref 39–117)
Bilirubin, Direct: 0.2 mg/dL (ref 0.0–0.3)
Total Bilirubin: 1.2 mg/dL (ref 0.2–1.2)
Total Protein: 6.9 g/dL (ref 6.0–8.3)

## 2021-09-05 LAB — PSA: PSA: 0.47 ng/mL (ref 0.10–4.00)

## 2021-09-05 LAB — TSH: TSH: 1.91 u[IU]/mL (ref 0.35–5.50)

## 2021-09-06 ENCOUNTER — Ambulatory Visit: Payer: BC Managed Care – PPO | Admitting: Internal Medicine

## 2021-09-06 ENCOUNTER — Encounter: Payer: Self-pay | Admitting: Internal Medicine

## 2021-09-06 VITALS — BP 148/96 | HR 71 | Temp 99.4°F | Ht 68.0 in | Wt 208.0 lb

## 2021-09-06 DIAGNOSIS — E049 Nontoxic goiter, unspecified: Secondary | ICD-10-CM

## 2021-09-06 DIAGNOSIS — E7800 Pure hypercholesterolemia, unspecified: Secondary | ICD-10-CM

## 2021-09-06 DIAGNOSIS — R079 Chest pain, unspecified: Secondary | ICD-10-CM | POA: Diagnosis not present

## 2021-09-06 DIAGNOSIS — I10 Essential (primary) hypertension: Secondary | ICD-10-CM | POA: Diagnosis not present

## 2021-09-06 DIAGNOSIS — E538 Deficiency of other specified B group vitamins: Secondary | ICD-10-CM

## 2021-09-06 DIAGNOSIS — R739 Hyperglycemia, unspecified: Secondary | ICD-10-CM

## 2021-09-06 MED ORDER — PANTOPRAZOLE SODIUM 40 MG PO TBEC: 40.0000 mg | DELAYED_RELEASE_TABLET | Freq: Every day | ORAL | 3 refills | Status: DC

## 2021-09-06 NOTE — Progress Notes (Signed)
Patient ID: Julian Swanson, male   DOB: [birth year], 45 y.o.   MRN: [id number]        Chief Complaint: follow up htn, thyroid, chest pain, low b12 and hld       HPI:  Julian Swanson is a 45 y.o. male here with c/o right upper mid chest discomfort x 4 days, mild, intermittent, without raidation or other associated symptoms like sob, n/v, palp or dizziness.  Has been assoc with belching with standing and walking, Denies fever, cough, seems worse last night to lie down but maybe better with taking his wife omeprazole, has not tried tums or similar.  .thryoidj  Has been taking plenty of b12 daily for several months.  Has been trying to follow lower chol diet, does not want to take statin but willing for cardiac ct score.  BP at homehas been < 140/90   Pt denies fever, wt loss, night sweats, loss of appetite, or other constitutional symptoms   Pt denies polydipsia, polyuria, or           Wt Readings from Last 3 Encounters:  09/06/21 208 lb (94.3 kg)  09/02/21 208 lb (94.3 kg)  07/09/20 209 lb (94.8 kg)   BP Readings from Last 3 Encounters:  09/06/21 (!) 148/96  01/07/21 130/85  07/09/20 (!) 150/100         Past Medical History:  Diagnosis Date   Allergic rhinitis 10/31/2019   HLD (hyperlipidemia) 10/31/2019   Hypertension    History reviewed. No pertinent surgical history.  reports that he has never smoked. He has never used smokeless tobacco. He reports that he does not drink alcohol and does not use drugs. family history includes Diabetes in an other family member; Hypertension in his father, mother, and another family member. Allergies  Allergen Reactions   Amoxicillin-Pot Clavulanate     Rash on thighs   Penicillins     See Augmentin reaction   Sulfa Antibiotics     05/19/14 urticaria   Prednisone     No definitive reaction 05/19/14 known reaction denied   Current Outpatient Medications on File Prior to Visit  Medication Sig Dispense Refill   Aluminum & Magnesium Hydroxide  (MAGNESIUM-ALUMINUM PO) Take 1 tablet by mouth daily.      Ascorbic Acid (VITAMIN C) 100 MG tablet Take 100 mg by mouth daily.     Cholecalciferol (D3 ADULT PO) Take 1 tablet by mouth daily.      KRILL OIL PO Take 1 tablet by mouth daily.      metoprolol succinate (TOPROL-XL) 25 MG 24 hr tablet Take 1 tablet (25 mg total) by mouth daily. 90 tablet 3   vitamin B-12 (CYANOCOBALAMIN) 1000 MCG tablet Take 1 tablet (1,000 mcg total) by mouth daily. 90 tablet 3   Zinc Sulfate (ZINC 15 PO) Take 1 tablet by mouth daily.      No current facility-administered medications on file prior to visit.        ROS:  All others reviewed and negative.  Objective        PE:  BP (!) 148/96 (BP Location: Left Arm, Patient Position: Sitting, Cuff Size: Large)   Pulse 71   Temp 99.4 F (37.4 C) (Oral)   Ht 5\' 8"  (1.727 m)   Wt 208 lb (94.3 kg)   SpO2 98%   BMI 31.63 kg/m                 Constitutional: Pt appears in NAD  HENT: Head: NCAT.                Right Ear: External ear normal.                 Left Ear: External ear normal.                Eyes: . Pupils are equal, round, and reactive to light. Conjunctivae and EOM are normal               Nose: without d/c or deformity               Neck: Neck supple. Gross normal ROM, ? Right thyroid enlarged nodular mild nontender               Cardiovascular: Normal rate and regular rhythm.                 Pulmonary/Chest: Effort normal and breath sounds without rales or wheezing.                Abd:  Soft, NT, ND, + BS, no organomegaly               Neurological: Pt is alert. At baseline orientation, motor grossly intact               Skin: Skin is warm. No rashes, no other new lesions, LE edema - none               Psychiatric: Pt behavior is normal without agitation   Micro: none  Cardiac tracings I have personally interpreted today:  ECG - sinus brady 59  Pertinent Radiological findings (summarize): none   Lab Results  Component Value  Date   WBC 5.7 09/05/2021   HGB 15.5 09/05/2021   HCT 46.0 09/05/2021   PLT 162.0 09/05/2021   GLUCOSE 93 09/05/2021   CHOL 199 09/05/2021   TRIG 82.0 09/05/2021   HDL 50.30 09/05/2021   LDLCALC 132 (H) 09/05/2021   ALT 17 09/05/2021   AST 17 09/05/2021   NA 139 09/05/2021   K 3.9 09/05/2021   CL 103 09/05/2021   CREATININE 1.14 09/05/2021   BUN 16 09/05/2021   CO2 29 09/05/2021   TSH 1.91 09/05/2021   PSA 0.47 09/05/2021   HGBA1C 5.3 09/05/2021   Assessment/Plan:  Julian Swanson is a 45 y.o. White or Caucasian [1] male with  has a past medical history of Allergic rhinitis (10/31/2019), HLD (hyperlipidemia) (10/31/2019), and Hypertension.  Chest pain Atypical, etiology unclear but most likely c/w GI related it seems, ECG reviewed, for cxr, empiric PPI, tums prn, and consider stress testing but declines for now.    Hyperglycemia Lab Results  Component Value Date   HGBA1C 5.3 09/05/2021   Stable, pt to continue current medical treatment  - diet   HLD (hyperlipidemia) Lab Results  Component Value Date   LDLCALC 132 (H) 09/05/2021   Uncontrolled, for lower chol diet, declines statin but ok for cardiac CT score to further evaluate  Goiter ?? Right thyroid mild enlarged, for thyroid u/s r/o nodule/goiter  Essential hypertension BP Readings from Last 3 Encounters:  09/06/21 (!) 148/96  01/07/21 130/85  07/09/20 (!) 150/100   Uncontrolled, likely reactive, pt to continue medical treatment toprol xl 25 and cont to monitor BP at home   B12 deficiency Lab Results  Component Value Date   VITAMINB12 1,443 (H) 09/05/2021   Overcontrolled, ok to decreased the B12 to mon wed fri  Followup: No follow-ups on file.  Oliver Barre, MD 09/14/2021 12:41 PM [facility]

## 2021-09-06 NOTE — Patient Instructions (Addendum)
Your EKG was Ok today  Ok to back off on the B12 to mon- wed - fri  Please take all new medication as prescribed - the protonix for acid, to take for at least 1 month; also ok to take TUMS as needed to see if this helps  Please continue all other medications as before, and refills have been done if requested.  Please have the pharmacy call with any other refills you may need.  Please continue your efforts at being more active, low cholesterol diet, and weight control.  You are otherwise up to date with prevention measures today.  Please keep your appointments with your specialists as you may have planned  You will be contacted regarding the referral for: thyroid ultrasound  Please go to the XRAY Department in the first floor for the x-ray testing  We have discussed the Cardiac CT Score test to measure the calcification level (if any) in your heart arteries.  This test has been ordered in our Computer System, so please call [facility] directly, as they prefer this, at [phone] to be scheduled. NOTE:  this is $99 and insurance does not want to pay for it  We can hold on the statin for now, but if the Cardiac CT score is abnormal, you would want the Statin to help from getting worse    .

## 2021-09-06 NOTE — Progress Notes (Signed)
Patient ID: Julian Swanson, male   DOB: 14-Mar-1976, 45 y.o.   MRN: [id number]

## 2021-09-07 ENCOUNTER — Telehealth: Payer: Self-pay

## 2021-09-09 NOTE — Telephone Encounter (Signed)
I don't see a diagnosis in the chart that will cover Pantoprazole. Please advise

## 2021-09-10 NOTE — Telephone Encounter (Signed)
Ok for code for aute gastritis

## 2021-09-11 ENCOUNTER — Encounter: Payer: Self-pay | Admitting: Internal Medicine

## 2021-09-11 ENCOUNTER — Ambulatory Visit
Admission: RE | Admit: 2021-09-11 | Discharge: 2021-09-11 | Disposition: A | Discharge status: Home / Self Care | Payer: BC Managed Care – PPO | Source: Ambulatory Visit | Attending: Internal Medicine | Admitting: Internal Medicine

## 2021-09-11 DIAGNOSIS — E049 Nontoxic goiter, unspecified: Secondary | ICD-10-CM

## 2021-09-11 DIAGNOSIS — E042 Nontoxic multinodular goiter: Secondary | ICD-10-CM | POA: Diagnosis not present

## 2021-09-11 NOTE — Telephone Encounter (Signed)
PA submitted to plan through CoverMyMeds

## 2021-09-14 NOTE — Assessment & Plan Note (Signed)
Atypical, etiology unclear but most likely c/w GI related it seems, ECG reviewed, for cxr, empiric PPI, tums prn, and consider stress testing but declines for now.

## 2021-09-14 NOTE — Assessment & Plan Note (Signed)
BP Readings from Last 3 Encounters:  09/06/21 (!) 148/96  01/07/21 130/85  07/09/20 (!) 150/100   Uncontrolled, likely reactive, pt to continue medical treatment toprol xl 25 and cont to monitor BP at home

## 2021-09-14 NOTE — Assessment & Plan Note (Signed)
??   Right thyroid mild enlarged, for thyroid u/s r/o nodule/goiter

## 2021-09-14 NOTE — Assessment & Plan Note (Signed)
Lab Results  Component Value Date   VITAMINB12 1,443 (H) 09/05/2021   Overcontrolled, ok to decreased the B12 to mon wed fri

## 2021-09-14 NOTE — Assessment & Plan Note (Signed)
Lab Results  Component Value Date   HGBA1C 5.3 09/05/2021   Stable, pt to continue current medical treatment  - diet

## 2021-09-14 NOTE — Assessment & Plan Note (Signed)
Lab Results  Component Value Date   LDLCALC 132 (H) 09/05/2021   Uncontrolled, for lower chol diet, declines statin but ok for cardiac CT score to further evaluate

## 2021-10-08 ENCOUNTER — Ambulatory Visit: Payer: BC Managed Care – PPO | Admitting: Internal Medicine

## 2021-10-08 ENCOUNTER — Encounter: Payer: Self-pay | Admitting: Internal Medicine

## 2021-10-08 ENCOUNTER — Other Ambulatory Visit: Payer: Self-pay

## 2021-10-08 VITALS — BP 138/82 | HR 73 | Temp 98.4°F | Ht 68.0 in | Wt 214.0 lb

## 2021-10-08 DIAGNOSIS — K429 Umbilical hernia without obstruction or gangrene: Secondary | ICD-10-CM

## 2021-10-08 DIAGNOSIS — K439 Ventral hernia without obstruction or gangrene: Secondary | ICD-10-CM

## 2021-10-08 NOTE — Patient Instructions (Signed)
    A referral was ordered for [facility].         Someone from their office will call you to schedule an appointment.

## 2021-10-08 NOTE — Progress Notes (Signed)
Subjective:    Patient ID: Julian Swanson, male    DOB: 1976/07/10, 45 y.o.   MRN: [id number]  This visit occurred during the SARS-CoV-2 public health emergency.  Safety protocols were in place, including screening questions prior to the visit, additional usage of staff PPE, and extensive cleaning of exam room while observing appropriate contact time as indicated for disinfecting solutions.    HPI The patient is here for an acute visit.   He has a ventral and umbilical hernia.  He has had them for years.  They have never bothered him, except for recently his ventral hernia has become larger, hard, he is unable to push it back in is causing discomfort in the area.  He denies any significant pain, but does have discomfort.  He has no discomfort with the umbilical hernia.    Medications and allergies reviewed with patient and updated if appropriate.  Patient Active Problem List   Diagnosis Date Noted   Chest pain 09/06/2021   Goiter 09/06/2021   Hyperglycemia 01/07/2021   B12 deficiency 01/07/2021   URI (upper respiratory infection) 01/07/2021   Heart murmur 07/09/2020   Acute diverticulitis 01/04/2020   Umbilical hernia 01/04/2020   HLD (hyperlipidemia) 10/31/2019   Allergic rhinitis 10/31/2019   Encounter for general adult medical examination with abnormal findings 01/10/2019   Ventral hernia 12/01/2016   Essential hypertension 10/28/2016   GILBERT'S SYNDROME 10/20/2007    Current Outpatient Medications on File Prior to Visit  Medication Sig Dispense Refill   Aluminum & Magnesium Hydroxide (MAGNESIUM-ALUMINUM PO) Take 1 tablet by mouth daily.      Ascorbic Acid (VITAMIN C) 100 MG tablet Take 100 mg by mouth daily.     Cholecalciferol (D3 ADULT PO) Take 1 tablet by mouth daily.      KRILL OIL PO Take 1 tablet by mouth daily.      metoprolol succinate (TOPROL-XL) 25 MG 24 hr tablet Take 1 tablet (25 mg total) by mouth daily. 90 tablet 3   vitamin B-12 (CYANOCOBALAMIN)  1000 MCG tablet Take 1 tablet (1,000 mcg total) by mouth daily. 90 tablet 3   Zinc Sulfate (ZINC 15 PO) Take 1 tablet by mouth daily.      pantoprazole (PROTONIX) 40 MG tablet Take 1 tablet (40 mg total) by mouth daily. (Patient not taking: Reported on 10/08/2021) 90 tablet 3   No current facility-administered medications on file prior to visit.    Past Medical History:  Diagnosis Date   Allergic rhinitis 10/31/2019   HLD (hyperlipidemia) 10/31/2019   Hypertension     No past surgical history on file.  Social History   Socioeconomic History   Marital status: Married    Spouse name: Not on file   Number of children: 2   Years of education: 16   Highest education level: Not on file  Occupational History   Occupation: [employer]  Tobacco Use   Smoking status: Never   Smokeless tobacco: Never  Vaping Use   Vaping Use: Never used  Substance and Sexual Activity   Alcohol use: No   Drug use: No   Sexual activity: Not on file  Other Topics Concern   Not on file  Social History Narrative   Fun: Basketball, yard work, Youth Group    Social Determinants of Corporate investment banker Strain: Not on file  Food Insecurity: Not on file  Transportation Needs: Not on file  Physical Activity: Not on file  Stress: Not on file  Social Connections: Not on file    Family History  Problem Relation Age of Onset   Diabetes Other        relation not spec.   Hypertension Other        relation not spec.   Hypertension Mother    Hypertension Father     Review of Systems     Objective:   Vitals:   10/08/21 1530  BP: 138/82  Pulse: 73  Temp: 98.4 F (36.9 C)  SpO2: 97%   BP Readings from Last 3 Encounters:  10/08/21 138/82  09/06/21 (!) 148/96  01/07/21 130/85   Wt Readings from Last 3 Encounters:  10/08/21 214 lb (97.1 kg)  09/06/21 208 lb (94.3 kg)  09/02/21 208 lb (94.3 kg)   Body mass index is 32.54 kg/m.   Physical Exam Constitutional:      General: He is not  in acute distress.    Appearance: Normal appearance. He is not ill-appearing.  HENT:     Head: Normocephalic and atraumatic.  Abdominal:     Palpations: Abdomen is soft.     Tenderness: There is abdominal tenderness (Minimal with palpation of ventral hernia). There is no guarding or rebound.     Hernia: A hernia (Umbilical hernia approximately the size of a ping-pong ball, reducible and nontender.  Ventral hernia approximately the size of an avocado or small mango-following and not reducible-minimal discomfort) is present.  Skin:    General: Skin is warm and dry.     Findings: No erythema.  Neurological:     Mental Status: He is alert.           Assessment & Plan:    Umbilical and ventral hernia currently Chronic Increasing discomfort, size of ventral hernia, which is now tender and not reducible.  Concerns for mild incarceration Umbilical hernia nontender, reducible  Referral for [facility] surgery  Discussed concerns with incarceration and if his pain worsens and does not go away he will call us immediately

## 2021-10-28 ENCOUNTER — Encounter: Payer: Self-pay | Admitting: Internal Medicine

## 2021-12-11 ENCOUNTER — Other Ambulatory Visit: Payer: Self-pay | Admitting: Surgery

## 2021-12-11 DIAGNOSIS — K420 Umbilical hernia with obstruction, without gangrene: Secondary | ICD-10-CM | POA: Diagnosis not present

## 2021-12-11 DIAGNOSIS — K436 Other and unspecified ventral hernia with obstruction, without gangrene: Secondary | ICD-10-CM | POA: Diagnosis not present

## 2022-01-05 ENCOUNTER — Other Ambulatory Visit: Payer: Self-pay | Admitting: Internal Medicine

## 2022-01-05 DIAGNOSIS — I10 Essential (primary) hypertension: Secondary | ICD-10-CM

## 2022-01-05 NOTE — Telephone Encounter (Signed)
Please refill as per office routine med refill policy (all routine meds to be refilled for 3 mo or monthly (per pt preference) up to one year from last visit, then month to month grace period for 3 mo, then further med refills will have to be denied) ? ?

## 2022-01-26 ENCOUNTER — Ambulatory Visit (HOSPITAL_COMMUNITY)
Admission: EM | Admit: 2022-01-26 | Discharge: 2022-01-26 | Disposition: A | Discharge status: Home / Self Care | Payer: BC Managed Care – PPO | Attending: Family Medicine | Admitting: Family Medicine

## 2022-01-26 ENCOUNTER — Other Ambulatory Visit: Payer: Self-pay

## 2022-01-26 ENCOUNTER — Encounter (HOSPITAL_COMMUNITY): Payer: Self-pay | Admitting: Emergency Medicine

## 2022-01-26 DIAGNOSIS — K5732 Diverticulitis of large intestine without perforation or abscess without bleeding: Secondary | ICD-10-CM | POA: Diagnosis not present

## 2022-01-26 MED ORDER — CIPROFLOXACIN HCL 500 MG PO TABS: 500.0000 mg | ORAL_TABLET | Freq: Two times a day (BID) | ORAL | 0 refills | Status: AC

## 2022-01-26 MED ORDER — METRONIDAZOLE 500 MG PO TABS: 500.0000 mg | ORAL_TABLET | Freq: Three times a day (TID) | ORAL | 0 refills | Status: AC

## 2022-01-26 NOTE — Discharge Instructions (Addendum)
We are treating you for diverticulitis Take the medication as prescribed Bland diet.  See PCP next week for follow up.  If worse go to the ER

## 2022-01-26 NOTE — ED Triage Notes (Signed)
Since Friday having lower abd pains. Reports small BMs since Friday and feels hasnt had "a full one". Pain worse with coughing or moving around had fevers since last night.  Has hx upper and lower hernias

## 2022-01-26 NOTE — ED Provider Notes (Signed)
[facility]    CSN: [id number] Arrival date & time: 01/26/22  1003      History   Chief Complaint Chief Complaint  Patient presents with   Abdominal Pain    HPI [place] Christon Parada is a 46 y.o. male.   46 year old male with PMH of diverticulitis. Presents today with lower abdominal pain. This has been present since Friday. Worse last night with fever. Fever 100.5. Has had some constipation.  No N,V,D. No urinary symptoms. No blood in the stool.    Abdominal Pain  Past Medical History:  Diagnosis Date   Allergic rhinitis 10/31/2019   HLD (hyperlipidemia) 10/31/2019   Hypertension     Patient Active Problem List   Diagnosis Date Noted   Chest pain 09/06/2021   Goiter 09/06/2021   Hyperglycemia 01/07/2021   B12 deficiency 01/07/2021   URI (upper respiratory infection) 01/07/2021   Heart murmur 07/09/2020   Acute diverticulitis 01/04/2020   Umbilical hernia 01/04/2020   HLD (hyperlipidemia) 10/31/2019   Allergic rhinitis 10/31/2019   Encounter for general adult medical examination with abnormal findings 01/10/2019   Ventral hernia 12/01/2016   Essential hypertension 10/28/2016   GILBERT'S SYNDROME 10/20/2007    History reviewed. No pertinent surgical history.     Home Medications    Prior to Admission medications   Medication Sig Start Date End Date Taking? Authorizing Provider  ciprofloxacin (CIPRO) 500 MG tablet Take 1 tablet (500 mg total) by mouth every 12 (twelve) hours for 7 days. 01/26/22 02/02/22 Yes Persais Ethridge A, NP  metroNIDAZOLE (FLAGYL) 500 MG tablet Take 1 tablet (500 mg total) by mouth 3 (three) times daily for 7 days. 01/26/22 02/02/22 Yes Jaydien Panepinto A, NP  Aluminum & Magnesium Hydroxide (MAGNESIUM-ALUMINUM PO) Take 1 tablet by mouth daily.     [provider]  Ascorbic Acid (VITAMIN C) 100 MG tablet Take 100 mg by mouth daily.    [provider]  Cholecalciferol (D3 ADULT PO) Take 1 tablet by mouth daily.      [provider]  KRILL OIL PO Take 1 tablet by mouth daily.     [provider]  metoprolol succinate (TOPROL-XL) 25 MG 24 hr tablet TAKE 1 TABLET (25 MG TOTAL) BY MOUTH DAILY. 01/06/22   Corwin Levins, MD  pantoprazole (PROTONIX) 40 MG tablet Take 1 tablet (40 mg total) by mouth daily. Patient not taking: Reported on 10/08/2021 09/06/21   Corwin Levins, MD  vitamin B-12 (CYANOCOBALAMIN) 1000 MCG tablet Take 1 tablet (1,000 mcg total) by mouth daily. 01/07/21   Corwin Levins, MD  Zinc Sulfate (ZINC 15 PO) Take 1 tablet by mouth daily.     [provider]    Family History Family History  Problem Relation Age of Onset   Diabetes Other        relation not spec.   Hypertension Other        relation not spec.   Hypertension Mother    Hypertension Father     Social History Social History   Tobacco Use   Smoking status: Never   Smokeless tobacco: Never  Vaping Use   Vaping Use: Never used  Substance Use Topics   Alcohol use: No   Drug use: No     Allergies   Amoxicillin-pot clavulanate, Penicillins, Sulfa antibiotics, and Prednisone   Review of Systems Review of Systems  Gastrointestinal:  Positive for abdominal pain.    Physical Exam Triage Vital Signs ED Triage  Vitals  Enc Vitals Group     BP 01/26/22 1029 (!) 171/119     Pulse Rate 01/26/22 1029 88     Resp 01/26/22 1029 17     Temp 01/26/22 1029 99.3 F (37.4 C)     Temp Source 01/26/22 1029 Oral     SpO2 01/26/22 1029 95 %     Weight --      Height --      Head Circumference --      Peak Flow --      Pain Score 01/26/22 1027 7     Pain Loc --      Pain Edu? --      Excl. in GC? --    No data found.  Updated Vital Signs BP (!) 171/119 (BP Location: Right Arm)    Pulse 88    Temp 99.3 F (37.4 C) (Oral)    Resp 17    SpO2 95%   Visual Acuity Right Eye Distance:   Left Eye Distance:   Bilateral Distance:    Right Eye Near:   Left Eye Near:    Bilateral  Near:     Physical Exam Vitals and nursing note reviewed.  Constitutional:      General: He is not in acute distress.    Appearance: He is not ill-appearing, toxic-appearing or diaphoretic.  Abdominal:     General: Bowel sounds are normal.     Tenderness: There is abdominal tenderness in the left lower quadrant. There is no right CVA tenderness or left CVA tenderness.  Skin:    General: Skin is warm and dry.  Neurological:     Mental Status: He is alert.  Psychiatric:        Mood and Affect: Mood normal.     UC Treatments / Results  Labs (all labs ordered are listed, but only abnormal results are displayed) Labs Reviewed - No data to display  EKG   Radiology No results found.  Procedures Procedures (including critical care time)  Medications Ordered in UC Medications - No data to display  Initial Impression / Assessment and Plan / UC Course  I have reviewed the triage vital signs and the nursing notes.  Pertinent labs & imaging results that were available during my care of the patient were reviewed by me and considered in my medical decision making (see chart for details).  Clinical Course as of 01/26/22 1129  Sun Jan 26, 2022  1129 Rechecked BP 146/103 [TB]    Clinical Course User Index [TB] Dahlia Byes A, NP    Diverticulitis- treating with dual therapy.  Bland diet.  ER precautions given. See PCP for f/u Final Clinical Impressions(s) / UC Diagnoses   Final diagnoses:  Diverticulitis of colon     Discharge Instructions      We are treating you for diverticulitis Take the medication as prescribed Bland diet.  See PCP next week for follow up.  If worse go to the ER      ED Prescriptions     Medication Sig Dispense Auth. Provider   metroNIDAZOLE (FLAGYL) 500 MG tablet Take 1 tablet (500 mg total) by mouth 3 (three) times daily for 7 days. 21 tablet Jazmynn Pho A, NP   ciprofloxacin (CIPRO) 500 MG tablet Take 1 tablet (500 mg total) by mouth  every 12 (twelve) hours for 7 days. 14 tablet Missie Gehrig A, NP      PDMP not reviewed this encounter.   Jaci Lazier, Jalisia Puchalski A,  NP 01/26/22 1125

## 2022-04-14 ENCOUNTER — Other Ambulatory Visit: Payer: Self-pay | Admitting: Internal Medicine

## 2022-04-14 DIAGNOSIS — I10 Essential (primary) hypertension: Secondary | ICD-10-CM

## 2022-04-14 NOTE — Telephone Encounter (Signed)
Please refill as per office routine med refill policy (all routine meds to be refilled for 3 mo or monthly (per pt preference) up to one year from last visit, then month to month grace period for 3 mo, then further med refills will have to be denied) ? ?

## 2022-05-13 ENCOUNTER — Other Ambulatory Visit: Payer: Self-pay | Admitting: Internal Medicine

## 2022-05-13 DIAGNOSIS — I10 Essential (primary) hypertension: Secondary | ICD-10-CM

## 2022-05-13 NOTE — Telephone Encounter (Signed)
Please refill as per office routine med refill policy (all routine meds to be refilled for 3 mo or monthly (per pt preference) up to one year from last visit, then month to month grace period for 3 mo, then further med refills will have to be denied) ? ?

## 2022-06-02 ENCOUNTER — Telehealth: Payer: Self-pay | Admitting: Internal Medicine

## 2022-06-02 DIAGNOSIS — I10 Essential (primary) hypertension: Secondary | ICD-10-CM

## 2022-06-02 MED ORDER — METOPROLOL SUCCINATE ER 25 MG PO TB24: 25.0000 mg | ORAL_TABLET | Freq: Every day | ORAL | 0 refills | Status: DC

## 2022-06-02 NOTE — Telephone Encounter (Signed)
States he has a headache. does not know last bp.   Please fill Metoprolol, and let pt know when it has been filled.

## 2022-06-02 NOTE — Telephone Encounter (Signed)
Connected to [facility] 6.10.2023.  Caller states called in about bp medication, out of medication unable to get loaner dose. Pharmacy states needing to speak with MD office in order to fill script. No new or worsening sx

## 2022-06-05 ENCOUNTER — Ambulatory Visit: Payer: BC Managed Care – PPO | Admitting: Internal Medicine

## 2022-06-05 ENCOUNTER — Encounter: Payer: Self-pay | Admitting: Internal Medicine

## 2022-06-05 VITALS — BP 140/80 | HR 57 | Temp 98.1°F | Ht 68.0 in | Wt 209.0 lb

## 2022-06-05 DIAGNOSIS — E559 Vitamin D deficiency, unspecified: Secondary | ICD-10-CM

## 2022-06-05 DIAGNOSIS — E785 Hyperlipidemia, unspecified: Secondary | ICD-10-CM | POA: Diagnosis not present

## 2022-06-05 DIAGNOSIS — Z0001 Encounter for general adult medical examination with abnormal findings: Secondary | ICD-10-CM

## 2022-06-05 DIAGNOSIS — Z125 Encounter for screening for malignant neoplasm of prostate: Secondary | ICD-10-CM | POA: Diagnosis not present

## 2022-06-05 DIAGNOSIS — R739 Hyperglycemia, unspecified: Secondary | ICD-10-CM

## 2022-06-05 DIAGNOSIS — K439 Ventral hernia without obstruction or gangrene: Secondary | ICD-10-CM

## 2022-06-05 DIAGNOSIS — R011 Cardiac murmur, unspecified: Secondary | ICD-10-CM

## 2022-06-05 DIAGNOSIS — I10 Essential (primary) hypertension: Secondary | ICD-10-CM | POA: Diagnosis not present

## 2022-06-05 DIAGNOSIS — K429 Umbilical hernia without obstruction or gangrene: Secondary | ICD-10-CM

## 2022-06-05 DIAGNOSIS — E538 Deficiency of other specified B group vitamins: Secondary | ICD-10-CM

## 2022-06-05 NOTE — Patient Instructions (Signed)

## 2022-06-05 NOTE — Progress Notes (Signed)
Patient ID: Julian Swanson, male   DOB: [birth year], 46 y.o.   MRN: [id number]         Chief Complaint:: wellness exam and ventral and umbilical hernia       HPI:  Julian Swanson is a 46 y.o. male here for wellness exam; had cologuar 2021 neg, will need colnoscopy in 2024, decilens hep c screen, ow up to date                       Also was scheduled for ventral and umbilical hernia repar 12/2021 but deferred due to cost; Denies worsening reflux, abd pain, dysphagia, n/v, bowel change or blood.  Pt denies chest pain, increased sob or doe, wheezing, orthopnea, PND, increased LE swelling, palpitations, dizziness or syncope.   Pt denies fever, wt loss, night sweats, loss of appetite, or other constitutional symptoms   Pt denies polydipsia, polyuria, or new focal neuro s/s.      Wt Readings from Last 3 Encounters:  06/05/22 209 lb (94.8 kg)  10/08/21 214 lb (97.1 kg)  09/06/21 208 lb (94.3 kg)   BP Readings from Last 3 Encounters:  06/05/22 140/80  01/26/22 (!) 171/119  10/08/21 138/82   Immunization History  Administered Date(s) Administered   Tdap 01/10/2019   There are no preventive care reminders to display for this patient.     Past Medical History:  Diagnosis Date   Allergic rhinitis 10/31/2019   HLD (hyperlipidemia) 10/31/2019   Hypertension    History reviewed. No pertinent surgical history.  reports that he has never smoked. He has never used smokeless tobacco. He reports that he does not drink alcohol and does not use drugs. family history includes Diabetes in an other family member; Hypertension in his father, mother, and another family member. Allergies  Allergen Reactions   Amoxicillin-Pot Clavulanate     Rash on thighs   Penicillins     See Augmentin reaction   Sulfa Antibiotics     05/19/14 urticaria   Prednisone     No definitive reaction 05/19/14 known reaction denied   Current Outpatient Medications on File Prior to Visit  Medication Sig Dispense Refill    Aluminum & Magnesium Hydroxide (MAGNESIUM-ALUMINUM PO) Take 1 tablet by mouth daily.      Ascorbic Acid (VITAMIN C) 100 MG tablet Take 100 mg by mouth daily.     Cholecalciferol (D3 ADULT PO) Take 1 tablet by mouth daily.      KRILL OIL PO Take 1 tablet by mouth daily.      metoprolol succinate (TOPROL-XL) 25 MG 24 hr tablet Take 1 tablet (25 mg total) by mouth daily. Overdue for Annual appt w/labs must see provider for future refills 10 tablet 0   vitamin B-12 (CYANOCOBALAMIN) 1000 MCG tablet Take 1 tablet (1,000 mcg total) by mouth daily. 90 tablet 3   Zinc Sulfate (ZINC 15 PO) Take 1 tablet by mouth daily.      pantoprazole (PROTONIX) 40 MG tablet Take 1 tablet (40 mg total) by mouth daily. (Patient not taking: Reported on 10/08/2021) 90 tablet 3   No current facility-administered medications on file prior to visit.        ROS:  All others reviewed and negative.  Objective        PE:  BP 140/80 (BP Location: Left Arm, Patient Position: Sitting, Cuff Size: Large)   Pulse (!) 57   Temp 98.1 F (36.7 C) (Oral)   Ht 5\' 8"  (  1.727 m)   Wt 209 lb (94.8 kg)   SpO2 97%   BMI 31.78 kg/m                 Constitutional: Pt appears in NAD               HENT: Head: NCAT.                Right Ear: External ear normal.                 Left Ear: External ear normal.                Eyes: . Pupils are equal, round, and reactive to light. Conjunctivae and EOM are normal               Nose: without d/c or deformity               Neck: Neck supple. Gross normal ROM               Cardiovascular: Normal rate and regular rhythm.                 Pulmonary/Chest: Effort normal and breath sounds without rales or wheezing.                Abd:  Soft, NT, ND, + BS, no organomegaly, non tender reducible ventral and umbilical hernias noted               Neurological: Pt is alert. At baseline orientation, motor grossly intact               Skin: Skin is warm. No rashes, no other new lesions, LE edema - none                Psychiatric: Pt behavior is normal without agitation   Micro: none  Cardiac tracings I have personally interpreted today:  none  Pertinent Radiological findings (summarize): none   Lab Results  Component Value Date   WBC 5.7 09/05/2021   HGB 15.5 09/05/2021   HCT 46.0 09/05/2021   PLT 162.0 09/05/2021   GLUCOSE 93 09/05/2021   CHOL 199 09/05/2021   TRIG 82.0 09/05/2021   HDL 50.30 09/05/2021   LDLCALC 132 (H) 09/05/2021   ALT 17 09/05/2021   AST 17 09/05/2021   NA 139 09/05/2021   K 3.9 09/05/2021   CL 103 09/05/2021   CREATININE 1.14 09/05/2021   BUN 16 09/05/2021   CO2 29 09/05/2021   TSH 1.91 09/05/2021   PSA 0.47 09/05/2021   HGBA1C 5.3 09/05/2021   Assessment/Plan:  Julian Swanson is a 45 y.o. White or Caucasian [1] male with  has a past medical history of Allergic rhinitis (10/31/2019), HLD (hyperlipidemia) (10/31/2019), and Hypertension.  B12 deficiency Lab Results  Component Value Date   VITAMINB12 1,443 (H) 09/05/2021   Overcontrolled, to cont oral replacement - b12 1000 mcg at qod dosing  Encounter for general adult medical examination with abnormal findings Age and sex appropriate education and counseling updated with regular exercise and diet Referrals for preventative services - decliens hep c screen Immunizations addressed - none needed Smoking counseling  - none needed Evidence for depression or other mood disorder - none significant Most recent labs reviewed. I have personally reviewed and have noted: 1) the patient's medical and social history 2) The patient's current medications and supplements 3) The patient's height, weight, and BMI have been recorded in the chart  Hyperglycemia Lab Results  Component Value Date   HGBA1C 5.3 09/05/2021   Stable, pt to continue current medical treatment - for diet, wt control, and f/u a1c   HLD (hyperlipidemia) Lab Results  Component Value Date   LDLCALC 132 (H) 09/05/2021    Uncontroled, goal ldl < 100, pt to continue current low chol diet, declines statin for now   Essential hypertension BP Readings from Last 3 Encounters:  06/05/22 140/80  01/26/22 (!) 171/119  10/08/21 138/82   Borderline uncontrolled,, pt to continue medical treatment  toprol xl 25 mg qd , declines change in tx at this time   Ventral hernia Stable, to f/u general surgury prn  Umbilical hernia Stable, to f/u general surgury prn  Followup: Return in about 1 year (around 06/06/2023).  Oliver Barre, MD 06/07/2022 8:05 PM [facility]

## 2022-06-05 NOTE — Assessment & Plan Note (Addendum)
Lab Results  Component Value Date   VITAMINB12 1,443 (H) 09/05/2021   Overcontrolled, to cont oral replacement - b12 1000 mcg at qod dosing

## 2022-06-07 ENCOUNTER — Encounter: Payer: Self-pay | Admitting: Internal Medicine

## 2022-06-07 NOTE — Assessment & Plan Note (Signed)
Lab Results  Component Value Date   LDLCALC 132 (H) 09/05/2021   Uncontroled, goal ldl < 100, pt to continue current low chol diet, declines statin for now

## 2022-06-07 NOTE — Assessment & Plan Note (Signed)
Stable, to f/u general surgury prn 

## 2022-06-07 NOTE — Assessment & Plan Note (Signed)
BP Readings from Last 3 Encounters:  06/05/22 140/80  01/26/22 (!) 171/119  10/08/21 138/82   Borderline uncontrolled,, pt to continue medical treatment  toprol xl 25 mg qd , declines change in tx at this time

## 2022-06-07 NOTE — Assessment & Plan Note (Signed)
Age and sex appropriate education and counseling updated with regular exercise and diet Referrals for preventative services - decliens hep c screen Immunizations addressed - none needed Smoking counseling  - none needed Evidence for depression or other mood disorder - none significant Most recent labs reviewed. I have personally reviewed and have noted: 1) the patient's medical and social history 2) The patient's current medications and supplements 3) The patient's height, weight, and BMI have been recorded in the chart  

## 2022-06-07 NOTE — Assessment & Plan Note (Signed)
Lab Results  Component Value Date   HGBA1C 5.3 09/05/2021   Stable, pt to continue current medical treatment - for diet, wt control, and f/u a1c

## 2022-06-07 NOTE — Assessment & Plan Note (Signed)
Stable, to f/u general surgury prn

## 2022-06-12 ENCOUNTER — Telehealth: Payer: Self-pay | Admitting: Internal Medicine

## 2022-06-12 DIAGNOSIS — I10 Essential (primary) hypertension: Secondary | ICD-10-CM

## 2022-06-12 NOTE — Telephone Encounter (Signed)
Caller & Relationship to patient: [place] Detloff  Call back number: [phone]  Date of last office visit: 06/05/22  Date of next office visit: 06/08/23  Medication(s) to be filled:   metoprolol succinate (TOPROL-XL) 25 MG 24 hr tablet     Preferred Pharmacy:  [id number] [place], [place] - [address] Phone:  [phone]  Fax:  [phone]

## 2022-06-13 MED ORDER — METOPROLOL SUCCINATE ER 25 MG PO TB24: 25.0000 mg | ORAL_TABLET | Freq: Every day | ORAL | 3 refills | Status: DC

## 2022-07-04 LAB — CBC WITH DIFFERENTIAL/PLATELET
Basophils Absolute: 0.1 10*3/uL (ref 0.0–0.1)
Basophils Relative: 1.2 % (ref 0.0–3.0)
Eosinophils Absolute: 0.1 10*3/uL (ref 0.0–0.7)
Eosinophils Relative: 1.8 % (ref 0.0–5.0)
HCT: 45.8 % (ref 39.0–52.0)
Hemoglobin: 15.7 g/dL (ref 13.0–17.0)
Lymphocytes Relative: 29.8 % (ref 12.0–46.0)
Lymphs Abs: 1.5 10*3/uL (ref 0.7–4.0)
MCHC: 34.2 g/dL (ref 30.0–36.0)
MCV: 93.5 fl (ref 78.0–100.0)
Monocytes Absolute: 0.4 10*3/uL (ref 0.1–1.0)
Monocytes Relative: 8 % (ref 3.0–12.0)
Neutro Abs: 3 10*3/uL (ref 1.4–7.7)
Neutrophils Relative %: 59.2 % (ref 43.0–77.0)
Platelets: 180 10*3/uL (ref 150.0–400.0)
RBC: 4.89 Mil/uL (ref 4.22–5.81)
RDW: 13.2 % (ref 11.5–15.5)
WBC: 5.1 10*3/uL (ref 4.0–10.5)

## 2022-07-04 LAB — URINALYSIS, ROUTINE W REFLEX MICROSCOPIC
Bilirubin Urine: NEGATIVE
Hgb urine dipstick: NEGATIVE
Ketones, ur: NEGATIVE
Leukocytes,Ua: NEGATIVE
Nitrite: NEGATIVE
RBC / HPF: NONE SEEN (ref 0–?)
Specific Gravity, Urine: 1.015 (ref 1.000–1.030)
Total Protein, Urine: NEGATIVE
Urine Glucose: NEGATIVE
Urobilinogen, UA: 0.2 (ref 0.0–1.0)
pH: 7.5 (ref 5.0–8.0)

## 2022-07-04 LAB — VITAMIN D 25 HYDROXY (VIT D DEFICIENCY, FRACTURES): VITD: 42.2 ng/mL (ref 30.00–100.00)

## 2022-07-04 LAB — LIPID PANEL
Cholesterol: 193 mg/dL (ref 0–200)
HDL: 53.1 mg/dL (ref >39.00)
LDL Cholesterol: 124 mg/dL — ABNORMAL HIGH (ref 0–99)
NonHDL: 139.78
Total CHOL/HDL Ratio: 4
Triglycerides: 80 mg/dL (ref 0.0–149.0)
VLDL: 16 mg/dL (ref 0.0–40.0)

## 2022-07-04 LAB — HEPATIC FUNCTION PANEL
ALT: 17 U/L (ref 0–53)
AST: 19 U/L (ref 0–37)
Albumin: 4.5 g/dL (ref 3.5–5.2)
Alkaline Phosphatase: 53 U/L (ref 39–117)
Bilirubin, Direct: 0.1 mg/dL (ref 0.0–0.3)
Total Bilirubin: 0.8 mg/dL (ref 0.2–1.2)
Total Protein: 6.7 g/dL (ref 6.0–8.3)

## 2022-07-04 LAB — BASIC METABOLIC PANEL
BUN: 15 mg/dL (ref 6–23)
CO2: 27 mEq/L (ref 19–32)
Calcium: 9.6 mg/dL (ref 8.4–10.5)
Chloride: 105 mEq/L (ref 96–112)
Creatinine, Ser: 1.17 mg/dL (ref 0.40–1.50)
GFR: 73.95 mL/min (ref >60.00)
Glucose, Bld: 103 mg/dL — ABNORMAL HIGH (ref 70–99)
Potassium: 4.6 mEq/L (ref 3.5–5.1)
Sodium: 140 mEq/L (ref 135–145)

## 2022-07-04 LAB — VITAMIN B12: Vitamin B-12: 527 pg/mL (ref 211–911)

## 2022-07-04 LAB — PSA: PSA: 0.56 ng/mL (ref 0.10–4.00)

## 2022-07-04 LAB — HEMOGLOBIN A1C: Hgb A1c MFr Bld: 5.3 % (ref 4.6–6.5)

## 2022-07-04 LAB — TSH: TSH: 1.73 u[IU]/mL (ref 0.35–5.50)

## 2023-02-12 IMAGING — US US THYROID
1 series · 14 of 25 positions shown · non-contrast
Comparison: None.

CLINICAL DATA: Goiter exam

EXAM:
THYROID ULTRASOUND
TECHNIQUE: Ultrasound examination of the thyroid gland and adjacent soft
tissues was performed.

[Series 1: us thyroid · 0.08mm/px · 14 of 41 slices shown]
[im 1/41]
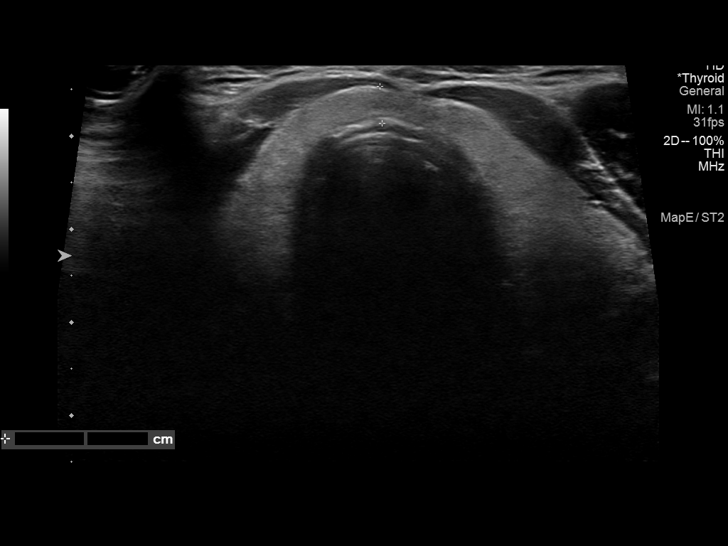
[im 4/41]
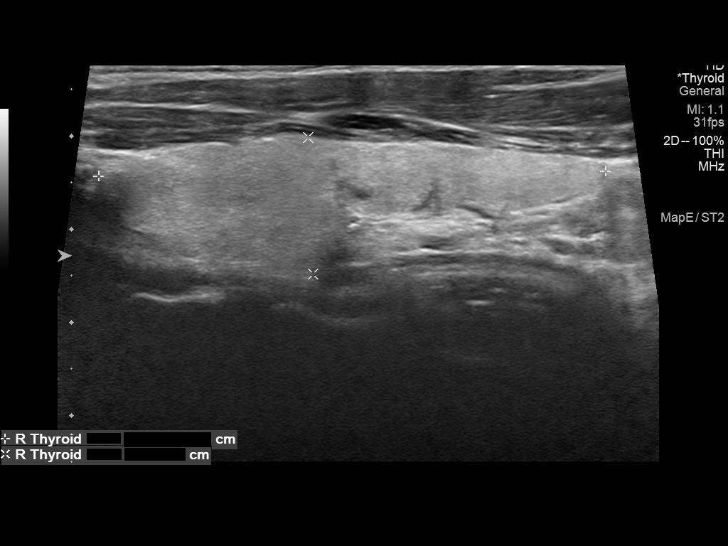
[im 7/41]
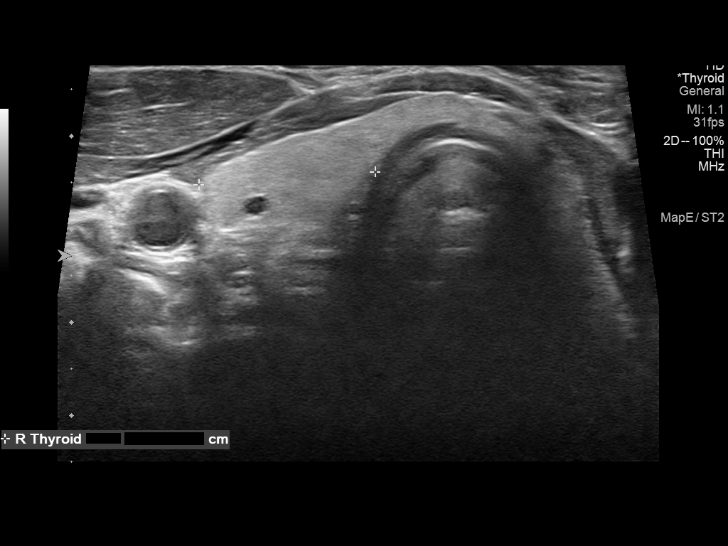
[im 11/41]
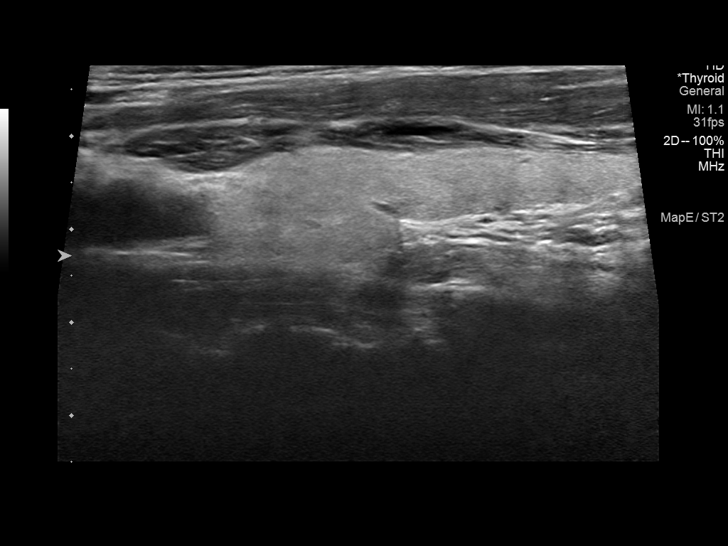
[im 14/41]
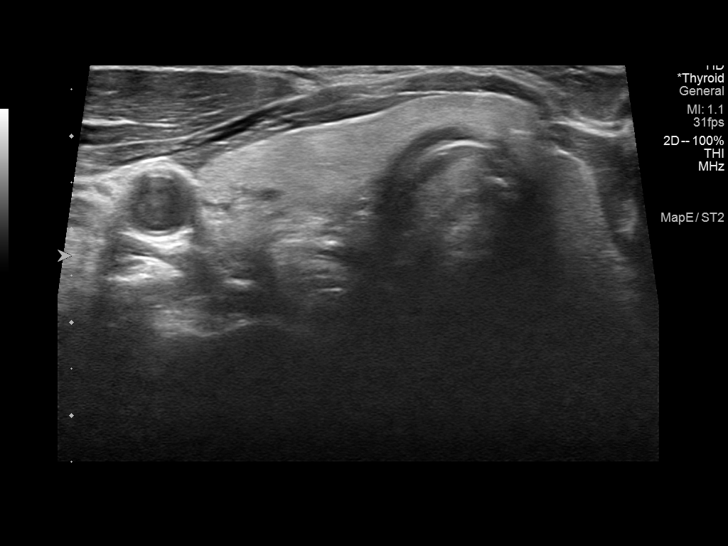
[im 16/41]
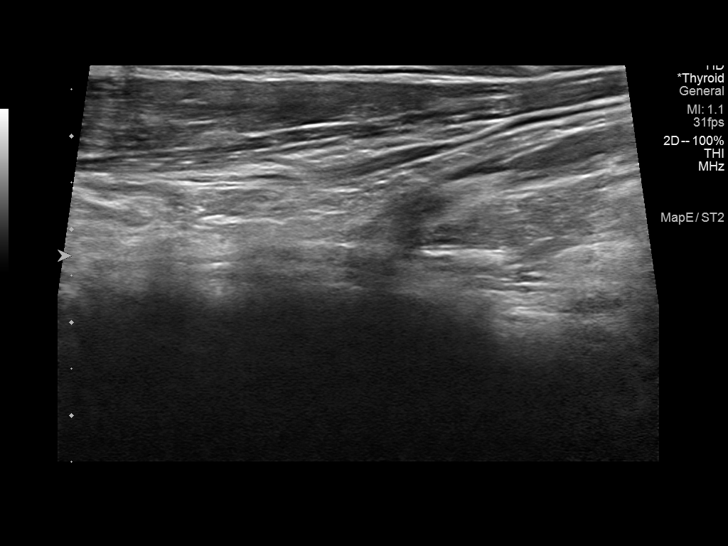
[im 19/41]
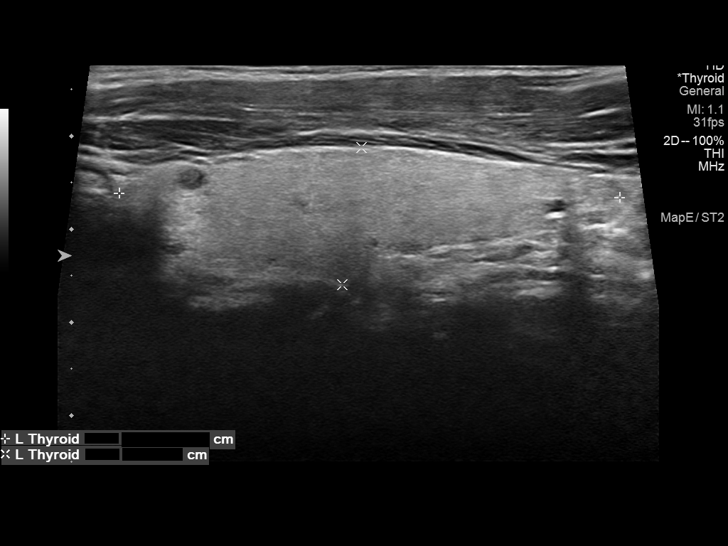
[im 22/41]
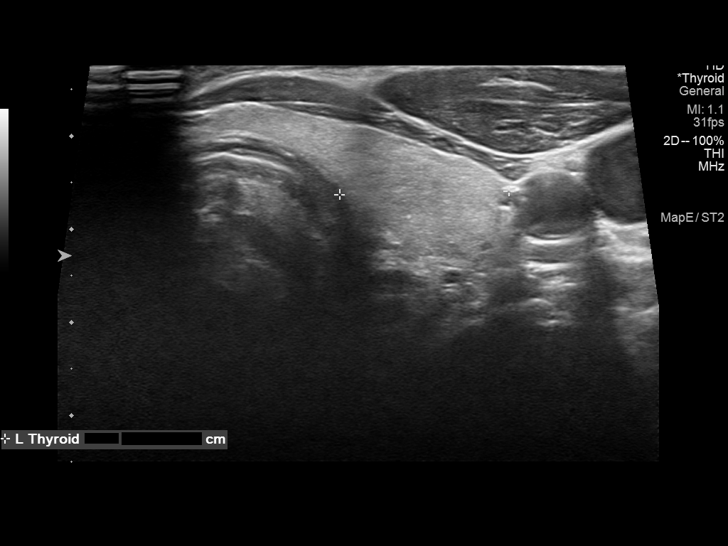
[im 26/41]
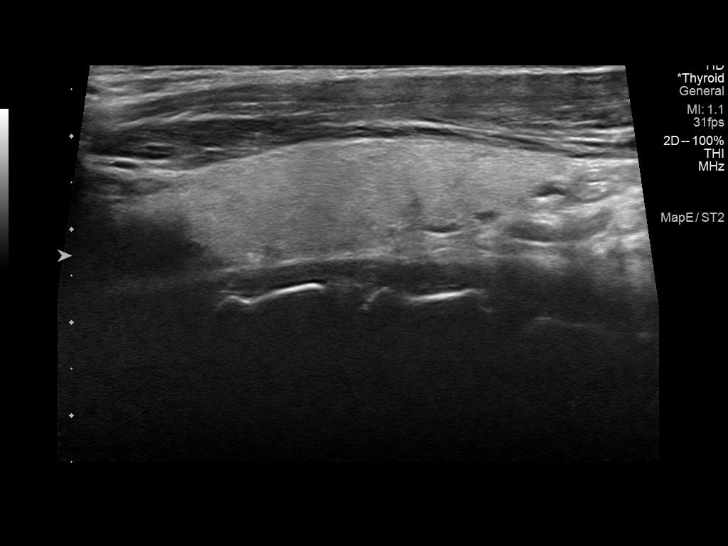
[im 27/41]
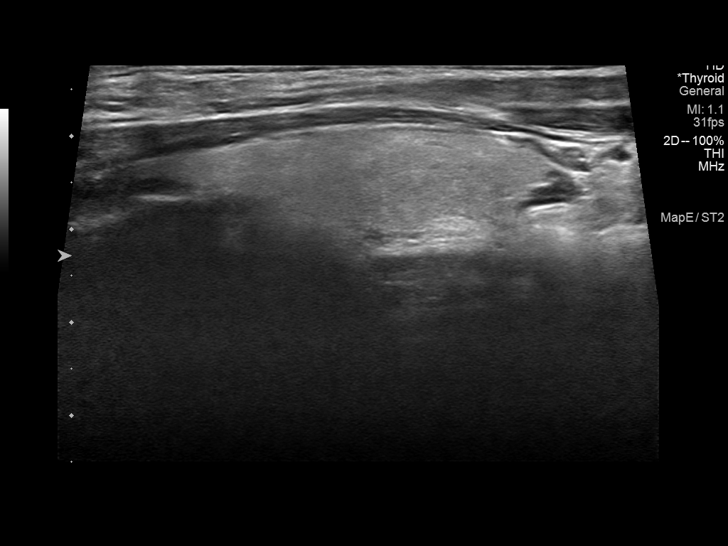
[im 31/41]
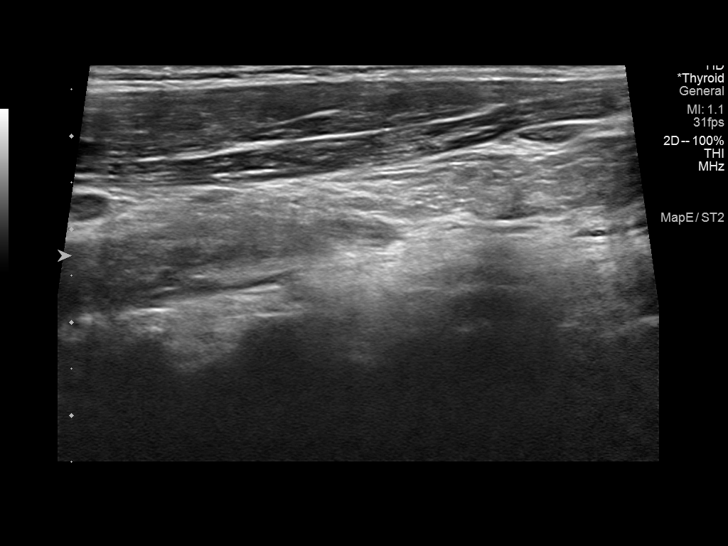
[im 34/41]
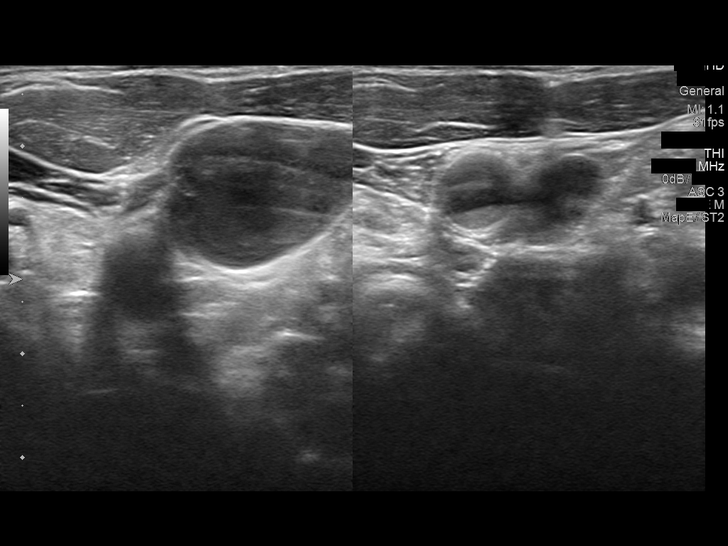
[im 37/41]
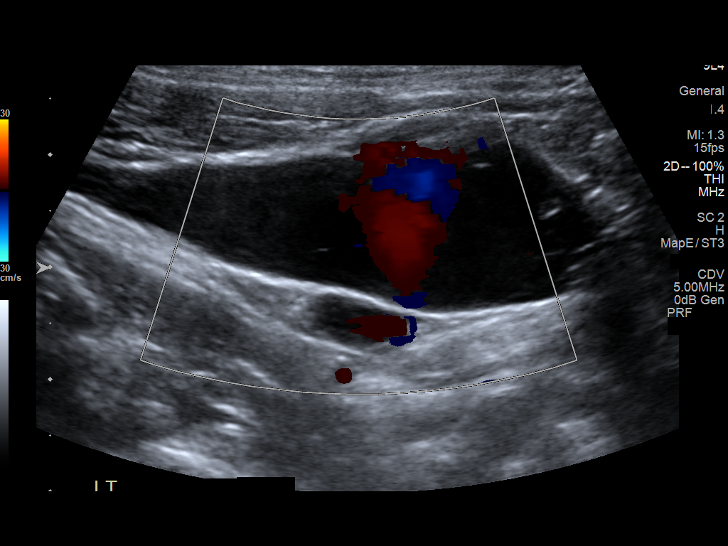
[im 41/41]
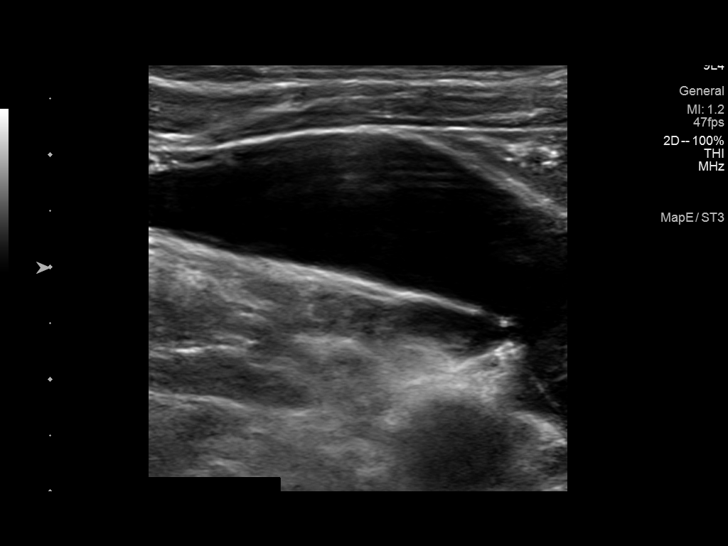

[14 of 25 positions shown; findings below may reference images not displayed]

FINDINGS: Parenchymal Echotexture: Normal

Isthmus: 4 mm

Right lobe: 5.4 x 1.5 x 1.9 cm

Left lobe: 5.4 x 1.5 x

_________________________________________________________

Estimated total number of nodules >/= 1 cm: 0

Number of spongiform nodules >/=  2 cm not described below (TR1): 0

Number of mixed cystic and solid nodules >/= 1.5 cm not described
below (TR2): 0

_________________________________________________________

Normal thyroid echotexture. There are a few scattered benign
subcentimeter small thyroid cysts all measuring 4 mm or less in
size. These would not meet criteria for any biopsy or follow-up and
are not fully described by TI rads criteria. No hypervascularity or
regional adenopathy.
IMPRESSION: No significant thyroid abnormality.

The above is in keeping with the ACR TI-RADS recommendations - [HOSPITAL] 7100;[DATE].

## 2023-04-02 DIAGNOSIS — M25572 Pain in left ankle and joints of left foot: Secondary | ICD-10-CM | POA: Diagnosis not present

## 2023-04-02 DIAGNOSIS — M7741 Metatarsalgia, right foot: Secondary | ICD-10-CM | POA: Diagnosis not present

## 2023-04-02 DIAGNOSIS — M25571 Pain in right ankle and joints of right foot: Secondary | ICD-10-CM | POA: Diagnosis not present

## 2023-04-27 ENCOUNTER — Encounter: Payer: BC Managed Care – PPO | Admitting: Sports Medicine

## 2023-06-03 ENCOUNTER — Telehealth: Payer: Self-pay | Admitting: Internal Medicine

## 2023-06-03 DIAGNOSIS — Z125 Encounter for screening for malignant neoplasm of prostate: Secondary | ICD-10-CM

## 2023-06-03 DIAGNOSIS — R739 Hyperglycemia, unspecified: Secondary | ICD-10-CM

## 2023-06-03 DIAGNOSIS — E559 Vitamin D deficiency, unspecified: Secondary | ICD-10-CM

## 2023-06-03 DIAGNOSIS — E538 Deficiency of other specified B group vitamins: Secondary | ICD-10-CM

## 2023-06-03 DIAGNOSIS — E7800 Pure hypercholesterolemia, unspecified: Secondary | ICD-10-CM

## 2023-06-03 NOTE — Telephone Encounter (Signed)
Patient is scheduled 06/22/2023 for his physical. He would like to know if orders for blood work can be put in for him to get them done prior to his appointment. Best callback is [phone].

## 2023-06-03 NOTE — Telephone Encounter (Signed)
No orders in epic. Pt has BLUE CROSS BLUE SHIELD.Marland KitchenRaechel Swanson

## 2023-06-03 NOTE — Telephone Encounter (Signed)
[place]res done

## 2023-06-03 NOTE — Telephone Encounter (Signed)
Labs ordered.

## 2023-06-04 NOTE — Telephone Encounter (Signed)
Called pt inform MD place orders made appt for 06/15/23.Marland KitchenRaechel Chute

## 2023-06-08 ENCOUNTER — Encounter: Payer: BC Managed Care – PPO | Admitting: Internal Medicine

## 2023-06-15 ENCOUNTER — Other Ambulatory Visit (INDEPENDENT_AMBULATORY_CARE_PROVIDER_SITE_OTHER): Payer: BC Managed Care – PPO

## 2023-06-15 DIAGNOSIS — E559 Vitamin D deficiency, unspecified: Secondary | ICD-10-CM

## 2023-06-15 DIAGNOSIS — E538 Deficiency of other specified B group vitamins: Secondary | ICD-10-CM

## 2023-06-15 DIAGNOSIS — R739 Hyperglycemia, unspecified: Secondary | ICD-10-CM | POA: Diagnosis not present

## 2023-06-15 DIAGNOSIS — Z125 Encounter for screening for malignant neoplasm of prostate: Secondary | ICD-10-CM | POA: Diagnosis not present

## 2023-06-15 DIAGNOSIS — E7800 Pure hypercholesterolemia, unspecified: Secondary | ICD-10-CM | POA: Diagnosis not present

## 2023-06-15 LAB — CBC WITH DIFFERENTIAL/PLATELET
Basophils Absolute: 0.1 10*3/uL (ref 0.0–0.1)
Basophils Relative: 1.1 % (ref 0.0–3.0)
Eosinophils Absolute: 0.1 10*3/uL (ref 0.0–0.7)
Eosinophils Relative: 1.5 % (ref 0.0–5.0)
HCT: 45.7 % (ref 39.0–52.0)
Hemoglobin: 15.4 g/dL (ref 13.0–17.0)
Lymphocytes Relative: 29.3 % (ref 12.0–46.0)
Lymphs Abs: 1.6 10*3/uL (ref 0.7–4.0)
MCHC: 33.8 g/dL (ref 30.0–36.0)
MCV: 94.2 fl (ref 78.0–100.0)
Monocytes Absolute: 0.5 10*3/uL (ref 0.1–1.0)
Monocytes Relative: 9.2 % (ref 3.0–12.0)
Neutro Abs: 3.2 10*3/uL (ref 1.4–7.7)
Neutrophils Relative %: 58.9 % (ref 43.0–77.0)
Platelets: 161 10*3/uL (ref 150.0–400.0)
RBC: 4.85 Mil/uL (ref 4.22–5.81)
RDW: 12.9 % (ref 11.5–15.5)
WBC: 5.5 10*3/uL (ref 4.0–10.5)

## 2023-06-15 LAB — URINALYSIS, ROUTINE W REFLEX MICROSCOPIC
Bilirubin Urine: NEGATIVE
Hgb urine dipstick: NEGATIVE
Ketones, ur: NEGATIVE
Leukocytes,Ua: NEGATIVE
Nitrite: NEGATIVE
RBC / HPF: NONE SEEN (ref 0–?)
Specific Gravity, Urine: 1.025 (ref 1.000–1.030)
Total Protein, Urine: NEGATIVE
Urine Glucose: NEGATIVE
Urobilinogen, UA: 0.2 (ref 0.0–1.0)
pH: 6.5 (ref 5.0–8.0)

## 2023-06-15 LAB — BASIC METABOLIC PANEL
BUN: 13 mg/dL (ref 6–23)
CO2: 26 mEq/L (ref 19–32)
Calcium: 9.5 mg/dL (ref 8.4–10.5)
Chloride: 106 mEq/L (ref 96–112)
Creatinine, Ser: 1.1 mg/dL (ref 0.40–1.50)
GFR: 79.11 mL/min (ref >60.00)
Glucose, Bld: 92 mg/dL (ref 70–99)
Potassium: 4.3 mEq/L (ref 3.5–5.1)
Sodium: 140 mEq/L (ref 135–145)

## 2023-06-15 LAB — HEPATIC FUNCTION PANEL
ALT: 17 U/L (ref 0–53)
AST: 17 U/L (ref 0–37)
Albumin: 4.3 g/dL (ref 3.5–5.2)
Alkaline Phosphatase: 53 U/L (ref 39–117)
Bilirubin, Direct: 0.1 mg/dL (ref 0.0–0.3)
Total Bilirubin: 0.7 mg/dL (ref 0.2–1.2)
Total Protein: 6.8 g/dL (ref 6.0–8.3)

## 2023-06-15 LAB — HEMOGLOBIN A1C: Hgb A1c MFr Bld: 5.2 % (ref 4.6–6.5)

## 2023-06-15 LAB — LIPID PANEL
Cholesterol: 169 mg/dL (ref 0–200)
HDL: 46.7 mg/dL (ref >39.00)
LDL Cholesterol: 96 mg/dL (ref 0–99)
NonHDL: 122.58
Total CHOL/HDL Ratio: 4
Triglycerides: 133 mg/dL (ref 0.0–149.0)
VLDL: 26.6 mg/dL (ref 0.0–40.0)

## 2023-06-15 LAB — VITAMIN B12: Vitamin B-12: 366 pg/mL (ref 211–911)

## 2023-06-15 LAB — PSA: PSA: 0.52 ng/mL (ref 0.10–4.00)

## 2023-06-15 LAB — VITAMIN D 25 HYDROXY (VIT D DEFICIENCY, FRACTURES): VITD: 37.37 ng/mL (ref 30.00–100.00)

## 2023-06-15 LAB — TSH: TSH: 1.41 u[IU]/mL (ref 0.35–5.50)

## 2023-06-15 NOTE — Progress Notes (Signed)
The test results show that your current treatment is OK, as the tests are stable.  Please continue the same plan.  There is no other need for change of treatment or further evaluation based on these results, at this time.  thanks 

## 2023-06-16 ENCOUNTER — Other Ambulatory Visit: Payer: Self-pay

## 2023-06-16 ENCOUNTER — Other Ambulatory Visit: Payer: Self-pay | Admitting: Internal Medicine

## 2023-06-16 DIAGNOSIS — I10 Essential (primary) hypertension: Secondary | ICD-10-CM

## 2023-06-22 ENCOUNTER — Encounter: Payer: Self-pay | Admitting: Internal Medicine

## 2023-06-22 ENCOUNTER — Ambulatory Visit (INDEPENDENT_AMBULATORY_CARE_PROVIDER_SITE_OTHER): Payer: BC Managed Care – PPO | Admitting: Internal Medicine

## 2023-06-22 VITALS — BP 128/76 | HR 70 | Temp 98.5°F | Ht 68.0 in | Wt 209.0 lb

## 2023-06-22 DIAGNOSIS — Z8719 Personal history of other diseases of the digestive system: Secondary | ICD-10-CM | POA: Insufficient documentation

## 2023-06-22 DIAGNOSIS — R739 Hyperglycemia, unspecified: Secondary | ICD-10-CM

## 2023-06-22 DIAGNOSIS — Z0000 Encounter for general adult medical examination without abnormal findings: Secondary | ICD-10-CM | POA: Diagnosis not present

## 2023-06-22 DIAGNOSIS — E538 Deficiency of other specified B group vitamins: Secondary | ICD-10-CM

## 2023-06-22 DIAGNOSIS — Z0001 Encounter for general adult medical examination with abnormal findings: Secondary | ICD-10-CM

## 2023-06-22 DIAGNOSIS — E559 Vitamin D deficiency, unspecified: Secondary | ICD-10-CM

## 2023-06-22 DIAGNOSIS — I10 Essential (primary) hypertension: Secondary | ICD-10-CM

## 2023-06-22 DIAGNOSIS — Z125 Encounter for screening for malignant neoplasm of prostate: Secondary | ICD-10-CM

## 2023-06-22 DIAGNOSIS — R935 Abnormal findings on diagnostic imaging of other abdominal regions, including retroperitoneum: Secondary | ICD-10-CM | POA: Insufficient documentation

## 2023-06-22 DIAGNOSIS — E7800 Pure hypercholesterolemia, unspecified: Secondary | ICD-10-CM

## 2023-06-22 MED ORDER — PANTOPRAZOLE SODIUM 40 MG PO TBEC: 40.0000 mg | DELAYED_RELEASE_TABLET | Freq: Every day | ORAL | 3 refills | Status: DC

## 2023-06-22 MED ORDER — METOPROLOL SUCCINATE ER 25 MG PO TB24: 25.0000 mg | ORAL_TABLET | Freq: Every day | ORAL | 3 refills | Status: DC

## 2023-06-22 NOTE — Assessment & Plan Note (Signed)
Lab Results  Component Value Date   LDLCALC 96 06/15/2023   Stable, pt to continue low chol diet, declines statin

## 2023-06-22 NOTE — Progress Notes (Signed)
Patient ID: Julian Swanson, male   DOB: 1976/10/25, 47 y.o.   MRN: [id number]         Chief Complaint:: wellness exam and abd wall hernias, low b12, htn, hld, hyperglycemia       HPI:  Julian Jailon Casique is a 47 y.o. male here for wellness exam; deciens hiv or hep c screen, declines colonoscopy, o/w up to date                        Also Pt denies chest pain, increased sob or doe, wheezing, orthopnea, PND, increased LE swelling, palpitations, dizziness or syncope.   Pt denies polydipsia, polyuria, or new focal neuro s/s.    Pt denies fever, wt loss, night sweats, loss of appetite, or other constitutional symptoms  Denies worsening reflux, abd pain, dysphagia, n/v, bowel change or blood.     Wt Readings from Last 3 Encounters:  06/22/23 209 lb (94.8 kg)  06/05/22 209 lb (94.8 kg)  10/08/21 214 lb (97.1 kg)   BP Readings from Last 3 Encounters:  06/22/23 128/76  06/05/22 140/80  01/26/22 (!) 171/119   Immunization History  Administered Date(s) Administered   Tdap 01/10/2019   Health Maintenance Due  Topic Date Due   HIV Screening  Never done   Hepatitis C Screening  Never done      Past Medical History:  Diagnosis Date   Allergic rhinitis 10/31/2019   HLD (hyperlipidemia) 10/31/2019   Hypertension    History reviewed. No pertinent surgical history.  reports that he has never smoked. He has never used smokeless tobacco. He reports that he does not drink alcohol and does not use drugs. family history includes Diabetes in an other family member; Hypertension in his father, mother, and another family member. Allergies  Allergen Reactions   Amoxicillin-Pot Clavulanate     Rash on thighs   Penicillins     See Augmentin reaction   Sulfa Antibiotics     05/19/14 urticaria   Prednisone     No definitive reaction 05/19/14 known reaction denied   Current Outpatient Medications on File Prior to Visit  Medication Sig Dispense Refill   Aluminum & Magnesium Hydroxide  (MAGNESIUM-ALUMINUM PO) Take 1 tablet by mouth daily.      Ascorbic Acid (VITAMIN C) 100 MG tablet Take 100 mg by mouth daily.     Cholecalciferol (D3 ADULT PO) Take 1 tablet by mouth daily.      KRILL OIL PO Take 1 tablet by mouth daily.      Magnesium 200 MG TABS 1 tablets with a meal Orally Once a day     metoprolol tartrate (LOPRESSOR) 25 MG tablet 25 mg.     vitamin B-12 (CYANOCOBALAMIN) 1000 MCG tablet Take 1 tablet (1,000 mcg total) by mouth daily. 90 tablet 3   Zinc Sulfate (ZINC 15 PO) Take 1 tablet by mouth daily.      No current facility-administered medications on file prior to visit.        ROS:  All others reviewed and negative.  Objective        PE:  BP 128/76 (BP Location: Right Arm, Patient Position: Sitting, Cuff Size: Normal)   Pulse 70   Temp 98.5 F (36.9 C) (Oral)   Ht 5\' 8"  (1.727 m)   Wt 209 lb (94.8 kg)   SpO2 98%   BMI 31.78 kg/m  Constitutional: Pt appears in NAD               HENT: Head: NCAT.                Right Ear: External ear normal.                 Left Ear: External ear normal.                Eyes: . Pupils are equal, round, and reactive to light. Conjunctivae and EOM are normal               Nose: without d/c or deformity               Neck: Neck supple. Gross normal ROM               Cardiovascular: Normal rate and regular rhythm.                 Pulmonary/Chest: Effort normal and breath sounds without rales or wheezing.                Abd:  Soft, NT, ND, + BS, no organomegaly               Neurological: Pt is alert. At baseline orientation, motor grossly intact               Skin: Skin is warm. No rashes, no other new lesions, LE edema - none               Psychiatric: Pt behavior is normal without agitation   Micro: none  Cardiac tracings I have personally interpreted today:  none  Pertinent Radiological findings (summarize): none   Lab Results  Component Value Date   WBC 5.5 06/15/2023   HGB 15.4 06/15/2023   HCT  45.7 06/15/2023   PLT 161.0 06/15/2023   GLUCOSE 92 06/15/2023   CHOL 169 06/15/2023   TRIG 133.0 06/15/2023   HDL 46.70 06/15/2023   LDLCALC 96 06/15/2023   ALT 17 06/15/2023   AST 17 06/15/2023   NA 140 06/15/2023   K 4.3 06/15/2023   CL 106 06/15/2023   CREATININE 1.10 06/15/2023   BUN 13 06/15/2023   CO2 26 06/15/2023   TSH 1.41 06/15/2023   PSA 0.52 06/15/2023   HGBA1C 5.2 06/15/2023   Assessment/Plan:  Julian Virat Distad is a 47 y.o. White or Caucasian [1] male with  has a past medical history of Allergic rhinitis (10/31/2019), HLD (hyperlipidemia) (10/31/2019), and Hypertension.  Encounter for general adult medical examination with abnormal findings Age and sex appropriate education and counseling updated with regular exercise and diet Referrals for preventative services - declines hep c and hiv screen, declines colonoscopy Immunizations addressed - none needed Smoking counseling  - none needed Evidence for depression or other mood disorder - none significant Most recent labs reviewed. I have personally reviewed and have noted: 1) the patient's medical and social history 2) The patient's current medications and supplements 3) The patient's height, weight, and BMI have been recorded in the chart   B12 deficiency Lab Results  Component Value Date   VITAMINB12 366 06/15/2023   Low normal, for oral replacement - b12 1000 mcg qd   Essential hypertension BP Readings from Last 3 Encounters:  06/22/23 128/76  06/05/22 140/80  01/26/22 (!) 171/119   Stable, pt to continue medical treatment toprol xl 25 qd   Current Outpatient Medications (Cardiovascular):    metoprolol  tartrate (LOPRESSOR) 25 MG tablet, 25 mg.   metoprolol succinate (TOPROL-XL) 25 MG 24 hr tablet, Take 1 tablet (25 mg total) by mouth daily.    Current Outpatient Medications (Hematological):    vitamin B-12 (CYANOCOBALAMIN) 1000 MCG tablet, Take 1 tablet (1,000 mcg total) by mouth  daily.  Current Outpatient Medications (Other):    Aluminum & Magnesium Hydroxide (MAGNESIUM-ALUMINUM PO), Take 1 tablet by mouth daily.    Ascorbic Acid (VITAMIN C) 100 MG tablet, Take 100 mg by mouth daily.   Cholecalciferol (D3 ADULT PO), Take 1 tablet by mouth daily.    KRILL OIL PO, Take 1 tablet by mouth daily.    Magnesium 200 MG TABS, 1 tablets with a meal Orally Once a day   Zinc Sulfate (ZINC 15 PO), Take 1 tablet by mouth daily.    pantoprazole (PROTONIX) 40 MG tablet, Take 1 tablet (40 mg total) by mouth daily.   HLD (hyperlipidemia) Lab Results  Component Value Date   LDLCALC 96 06/15/2023   Stable, pt to continue low chol diet, declines statin   Hyperglycemia Lab Results  Component Value Date   HGBA1C 5.2 06/15/2023   Stable, pt to continue current medical treatment  - diet, wt control   Vitamin D deficiency Last vitamin D Lab Results  Component Value Date   VD25OH 37.37 06/15/2023   Low, to start oral replacement  Followup: Return in about 1 year (around 06/21/2024).  Oliver Barre, MD 06/22/2023 9:36 PM [facility]

## 2023-06-22 NOTE — Assessment & Plan Note (Signed)
Lab Results  Component Value Date   HGBA1C 5.2 06/15/2023   Stable, pt to continue current medical treatment  - diet, wt control

## 2023-06-22 NOTE — Patient Instructions (Addendum)
Ok to doiuble up on the current Vit D3 you are taking  Ok to take B12 1000 mcg per day for at least 6 months to get tanked up again  Please continue all other medications as before, and refills have been done if requested.  Please have the pharmacy call with any other refills you may need.  Please continue your efforts at being more active, low cholesterol diet, and weight control.  You are otherwise up to date with prevention measures today.  Please keep your appointments with your specialists as you may have planned  Please call if you would like referral for screening colonoscopy, or need General Surgury for the hernias  Please make an Appointment to return for your 1 year visit, or sooner if needed, with Lab testing by Appointment as well, to be done about 3-5 days before at the FIRST FLOOR Lab (so this is for TWO appointments - please see the scheduling desk as you leave)

## 2023-06-22 NOTE — Assessment & Plan Note (Signed)
Last vitamin D Lab Results  Component Value Date   VD25OH 37.37 06/15/2023   Low, to start oral replacement

## 2023-06-22 NOTE — Assessment & Plan Note (Signed)
BP Readings from Last 3 Encounters:  06/22/23 128/76  06/05/22 140/80  01/26/22 (!) 171/119   Stable, pt to continue medical treatment toprol xl 25 qd   Current Outpatient Medications (Cardiovascular):    metoprolol tartrate (LOPRESSOR) 25 MG tablet, 25 mg.   metoprolol succinate (TOPROL-XL) 25 MG 24 hr tablet, Take 1 tablet (25 mg total) by mouth daily.    Current Outpatient Medications (Hematological):    vitamin B-12 (CYANOCOBALAMIN) 1000 MCG tablet, Take 1 tablet (1,000 mcg total) by mouth daily.  Current Outpatient Medications (Other):    Aluminum & Magnesium Hydroxide (MAGNESIUM-ALUMINUM PO), Take 1 tablet by mouth daily.    Ascorbic Acid (VITAMIN C) 100 MG tablet, Take 100 mg by mouth daily.   Cholecalciferol (D3 ADULT PO), Take 1 tablet by mouth daily.    KRILL OIL PO, Take 1 tablet by mouth daily.    Magnesium 200 MG TABS, 1 tablets with a meal Orally Once a day   Zinc Sulfate (ZINC 15 PO), Take 1 tablet by mouth daily.    pantoprazole (PROTONIX) 40 MG tablet, Take 1 tablet (40 mg total) by mouth daily.

## 2023-06-22 NOTE — Assessment & Plan Note (Signed)
Age and sex appropriate education and counseling updated with regular exercise and diet Referrals for preventative services - declines hep c and hiv screen, declines colonoscopy Immunizations addressed - none needed Smoking counseling  - none needed Evidence for depression or other mood disorder - none significant Most recent labs reviewed. I have personally reviewed and have noted: 1) the patient's medical and social history 2) The patient's current medications and supplements 3) The patient's height, weight, and BMI have been recorded in the chart

## 2023-06-22 NOTE — Assessment & Plan Note (Signed)
Lab Results  Component Value Date   VITAMINB12 366 06/15/2023   Low normal, for oral replacement - b12 1000 mcg qd

## 2023-06-23 ENCOUNTER — Encounter: Payer: Self-pay | Admitting: Internal Medicine

## 2023-06-25 ENCOUNTER — Other Ambulatory Visit (HOSPITAL_COMMUNITY): Payer: Self-pay

## 2023-06-25 ENCOUNTER — Telehealth: Payer: Self-pay

## 2023-06-25 NOTE — Telephone Encounter (Signed)
Pharmacy Patient Advocate Encounter   Received notification from CoverMyMeds that prior authorization for Pantoprazole 40mg  tabs is required/requested.   Insurance verification completed.   The patient is insured through [facility] .   Per test claim:   PA started via CoverMyMeds. KEY [id number] . Waiting for clinical questions to populate.

## 2023-06-29 NOTE — Telephone Encounter (Signed)
Approval letter attached to charts   

## 2023-09-27 ENCOUNTER — Telehealth: Payer: BC Managed Care – PPO | Admitting: Family

## 2023-09-27 DIAGNOSIS — J019 Acute sinusitis, unspecified: Secondary | ICD-10-CM | POA: Diagnosis not present

## 2023-09-27 MED ORDER — CETIRIZINE HCL 10 MG PO TABS
10.0000 mg | ORAL_TABLET | Freq: Every day | ORAL | 1 refills | Status: DC
Start: 2023-09-27 — End: 2024-07-04

## 2023-09-27 MED ORDER — DOXYCYCLINE HYCLATE 100 MG PO TABS
100.0000 mg | ORAL_TABLET | Freq: Two times a day (BID) | ORAL | 0 refills | Status: DC
Start: 2023-09-27 — End: 2024-07-04

## 2023-09-27 NOTE — Progress Notes (Signed)
Virtual Visit Consent   Julian Eric Pitt, you are scheduled for a virtual visit with a [facility] provider today. Just as with appointments in the office, your consent must be obtained to participate. Your consent will be active for this visit and any virtual visit you may have with one of our providers in the next 365 days. If you have a MyChart account, a copy of this consent can be sent to you electronically.  As this is a virtual visit, video technology does not allow for your provider to perform a traditional examination. This may limit your provider's ability to fully assess your condition. If your provider identifies any concerns that need to be evaluated in person or the need to arrange testing (such as labs, EKG, etc.), we will make arrangements to do so. Although advances in technology are sophisticated, we cannot ensure that it will always work on either your end or our end. If the connection with a video visit is poor, the visit may have to be switched to a telephone visit. With either a video or telephone visit, we are not always able to ensure that we have a secure connection.  By engaging in this virtual visit, you consent to the provision of healthcare and authorize for your insurance to be billed (if applicable) for the services provided during this visit. Depending on your insurance coverage, you may receive a charge related to this service.  I need to obtain your verbal consent now. Are you willing to proceed with your visit today? Julian Aeon Kessner has provided verbal consent on 09/27/2023 for a virtual visit (video or telephone). Jannifer Rodney, FNP  Date: 09/27/2023 10:51 AM  Virtual Visit via Video Note   I, Jannifer Rodney, connected with  Julian Swanson  ([id number], 1976-07-19) on 09/27/23 at 10:45 AM EDT by a video-enabled telemedicine application and verified that I am speaking with the correct person using two identifiers.  Location: Patient: Virtual Visit Location  Patient: Home Provider: Virtual Visit Location Provider: Home Office   I discussed the limitations of evaluation and management by telemedicine and the availability of in person appointments. The patient expressed understanding and agreed to proceed.    History of Present Illness: Julian Swanson is a 47 y.o. who identifies as a male who was assigned male at birth, and is being seen today for sinus pressure and fevers that started two days ago.  HPI: Sinusitis This is a new problem. The current episode started in the past 7 days. The problem has been gradually worsening since onset. The maximum temperature recorded prior to his arrival was 100.4 - 100.9 F. His pain is at a severity of 7/10. The pain is moderate. Associated symptoms include congestion, coughing, headaches and sinus pressure. Pertinent negatives include no ear pain, shortness of breath, sneezing or sore throat. Past treatments include nothing. The treatment provided no relief.    Problems:  Patient Active Problem List   Diagnosis Date Noted   Abnormal CT of the abdomen 06/22/2023   History of diverticulitis 06/22/2023   Vitamin D deficiency 06/22/2023   Chest pain 09/06/2021   Goiter 09/06/2021   Hyperglycemia 01/07/2021   B12 deficiency 01/07/2021   Heart murmur 07/09/2020   Acute diverticulitis 01/04/2020   Umbilical hernia 01/04/2020   HLD (hyperlipidemia) 10/31/2019   Allergic rhinitis 10/31/2019   Encounter for general adult medical examination with abnormal findings 01/10/2019   Ventral hernia 12/01/2016   Essential hypertension 10/28/2016   GILBERT'S SYNDROME 10/20/2007  Allergies:  Allergies  Allergen Reactions   Amoxicillin-Pot Clavulanate     Rash on thighs   Penicillins     See Augmentin reaction   Sulfa Antibiotics     05/19/14 urticaria   Prednisone     No definitive reaction 05/19/14 known reaction denied   Medications:  Current Outpatient Medications:    cetirizine (ZYRTEC ALLERGY) 10 MG  tablet, Take 1 tablet (10 mg total) by mouth daily., Disp: 90 tablet, Rfl: 1   doxycycline (VIBRA-TABS) 100 MG tablet, Take 1 tablet (100 mg total) by mouth 2 (two) times daily., Disp: 20 tablet, Rfl: 0   Aluminum & Magnesium Hydroxide (MAGNESIUM-ALUMINUM PO), Take 1 tablet by mouth daily. , Disp: , Rfl:    Ascorbic Acid (VITAMIN C) 100 MG tablet, Take 100 mg by mouth daily., Disp: , Rfl:    Cholecalciferol (D3 ADULT PO), Take 1 tablet by mouth daily. , Disp: , Rfl:    KRILL OIL PO, Take 1 tablet by mouth daily. , Disp: , Rfl:    Magnesium 200 MG TABS, 1 tablets with a meal Orally Once a day, Disp: , Rfl:    metoprolol succinate (TOPROL-XL) 25 MG 24 hr tablet, Take 1 tablet (25 mg total) by mouth daily., Disp: 90 tablet, Rfl: 3   metoprolol tartrate (LOPRESSOR) 25 MG tablet, 25 mg., Disp: , Rfl:    pantoprazole (PROTONIX) 40 MG tablet, Take 1 tablet (40 mg total) by mouth daily., Disp: 90 tablet, Rfl: 3   vitamin B-12 (CYANOCOBALAMIN) 1000 MCG tablet, Take 1 tablet (1,000 mcg total) by mouth daily., Disp: 90 tablet, Rfl: 3   Zinc Sulfate (ZINC 15 PO), Take 1 tablet by mouth daily. , Disp: , Rfl:   Observations/Objective: Patient is well-developed, well-nourished in no acute distress.  Resting comfortably  at home.  Head is normocephalic, atraumatic.  No labored breathing.  Speech is clear and coherent with logical content.  Patient is alert and oriented at baseline.  Nasal congestion  Assessment and Plan: 1. Acute sinusitis, recurrence not specified, unspecified location - cetirizine (ZYRTEC ALLERGY) 10 MG tablet; Take 1 tablet (10 mg total) by mouth daily.  Dispense: 90 tablet; Refill: 1 - doxycycline (VIBRA-TABS) 100 MG tablet; Take 1 tablet (100 mg total) by mouth 2 (two) times daily.  Dispense: 20 tablet; Refill: 0  - Take meds as prescribed - Use a cool mist humidifier  -Use saline nose sprays frequently -Force fluids -For any cough or congestion  Use plain Mucinex- regular  strength or max strength is fine -For fever or aces or pains- take tylenol or ibuprofen. -Throat lozenges if help -Follow up if symptoms worsen or do not improve   Follow Up Instructions: I discussed the assessment and treatment plan with the patient. The patient was provided an opportunity to ask questions and all were answered. The patient agreed with the plan and demonstrated an understanding of the instructions.  A copy of instructions were sent to the patient via MyChart unless otherwise noted below.     The patient was advised to call back or seek an in-person evaluation if the symptoms worsen or if the condition fails to improve as anticipated.  Time:  I spent 11 minutes with the patient via telehealth technology discussing the above problems/concerns.    Jannifer Rodney, FNP

## 2024-01-05 ENCOUNTER — Telehealth: Payer: Self-pay | Admitting: Internal Medicine

## 2024-01-05 NOTE — Telephone Encounter (Signed)
 Copied from CRM [id number]. Topic: Medical Record Request - Records Request >> Jan 05, 2024  9:31 AM Leotis ORN wrote: Reason for CRM: Pt is trying to renew is life insurance policy- the insurance companied denied him until he provides a copy  of the CT scan 08/2021 & all current info on hernias 10/22. Pt is requesting call back at [phone]

## 2024-01-06 NOTE — Telephone Encounter (Signed)
 I would certainly have no objection.  Can we direct him to medical records to request his records?  thanks

## 2024-01-13 NOTE — Telephone Encounter (Signed)
Called and provided medical records number to PT.

## 2024-05-03 ENCOUNTER — Encounter: Payer: Self-pay | Admitting: Internal Medicine

## 2024-05-09 DIAGNOSIS — M545 Low back pain, unspecified: Secondary | ICD-10-CM | POA: Diagnosis not present

## 2024-05-17 DIAGNOSIS — M9953 Intervertebral disc stenosis of neural canal of lumbar region: Secondary | ICD-10-CM | POA: Diagnosis not present

## 2024-05-17 DIAGNOSIS — M5116 Intervertebral disc disorders with radiculopathy, lumbar region: Secondary | ICD-10-CM | POA: Diagnosis not present

## 2024-05-17 DIAGNOSIS — M4316 Spondylolisthesis, lumbar region: Secondary | ICD-10-CM | POA: Diagnosis not present

## 2024-05-19 DIAGNOSIS — M545 Low back pain, unspecified: Secondary | ICD-10-CM | POA: Diagnosis not present

## 2024-06-03 DIAGNOSIS — M545 Low back pain, unspecified: Secondary | ICD-10-CM | POA: Diagnosis not present

## 2024-06-17 ENCOUNTER — Other Ambulatory Visit (INDEPENDENT_AMBULATORY_CARE_PROVIDER_SITE_OTHER)

## 2024-06-17 DIAGNOSIS — Z125 Encounter for screening for malignant neoplasm of prostate: Secondary | ICD-10-CM

## 2024-06-17 DIAGNOSIS — E538 Deficiency of other specified B group vitamins: Secondary | ICD-10-CM | POA: Diagnosis not present

## 2024-06-17 DIAGNOSIS — R739 Hyperglycemia, unspecified: Secondary | ICD-10-CM | POA: Diagnosis not present

## 2024-06-17 DIAGNOSIS — E559 Vitamin D deficiency, unspecified: Secondary | ICD-10-CM | POA: Diagnosis not present

## 2024-06-17 DIAGNOSIS — E7800 Pure hypercholesterolemia, unspecified: Secondary | ICD-10-CM | POA: Diagnosis not present

## 2024-06-17 LAB — CBC WITH DIFFERENTIAL/PLATELET
Basophils Absolute: 0.1 10*3/uL (ref 0.0–0.1)
Basophils Relative: 1.3 % (ref 0.0–3.0)
Eosinophils Absolute: 0.1 10*3/uL (ref 0.0–0.7)
Eosinophils Relative: 2.4 % (ref 0.0–5.0)
HCT: 45 % (ref 39.0–52.0)
Hemoglobin: 15.2 g/dL (ref 13.0–17.0)
Lymphocytes Relative: 27.5 % (ref 12.0–46.0)
Lymphs Abs: 1.4 10*3/uL (ref 0.7–4.0)
MCHC: 33.9 g/dL (ref 30.0–36.0)
MCV: 92.4 fl (ref 78.0–100.0)
Monocytes Absolute: 0.5 10*3/uL (ref 0.1–1.0)
Monocytes Relative: 10 % (ref 3.0–12.0)
Neutro Abs: 3.1 10*3/uL (ref 1.4–7.7)
Neutrophils Relative %: 58.8 % (ref 43.0–77.0)
Platelets: 163 10*3/uL (ref 150.0–400.0)
RBC: 4.87 Mil/uL (ref 4.22–5.81)
RDW: 13.5 % (ref 11.5–15.5)
WBC: 5.2 10*3/uL (ref 4.0–10.5)

## 2024-06-17 LAB — URINALYSIS, ROUTINE W REFLEX MICROSCOPIC
Bilirubin Urine: NEGATIVE
Hgb urine dipstick: NEGATIVE
Ketones, ur: NEGATIVE
Leukocytes,Ua: NEGATIVE
Nitrite: NEGATIVE
RBC / HPF: NONE SEEN (ref 0–?)
Specific Gravity, Urine: <=1.005 — AB (ref 1.000–1.030)
Total Protein, Urine: NEGATIVE
Urine Glucose: NEGATIVE
Urobilinogen, UA: 0.2 (ref 0.0–1.0)
WBC, UA: NONE SEEN (ref 0–?)
pH: 7 (ref 5.0–8.0)

## 2024-06-17 LAB — LIPID PANEL
Cholesterol: 195 mg/dL (ref 0–200)
HDL: 49.2 mg/dL (ref >39.00)
LDL Cholesterol: 127 mg/dL — ABNORMAL HIGH (ref 0–99)
NonHDL: 145.64
Total CHOL/HDL Ratio: 4
Triglycerides: 95 mg/dL (ref 0.0–149.0)
VLDL: 19 mg/dL (ref 0.0–40.0)

## 2024-06-17 LAB — HEPATIC FUNCTION PANEL
ALT: 18 U/L (ref 0–53)
AST: 19 U/L (ref 0–37)
Albumin: 4.4 g/dL (ref 3.5–5.2)
Alkaline Phosphatase: 50 U/L (ref 39–117)
Bilirubin, Direct: 0.2 mg/dL (ref 0.0–0.3)
Total Bilirubin: 1.2 mg/dL (ref 0.2–1.2)
Total Protein: 6.7 g/dL (ref 6.0–8.3)

## 2024-06-17 LAB — VITAMIN D 25 HYDROXY (VIT D DEFICIENCY, FRACTURES): VITD: 42.73 ng/mL (ref 30.00–100.00)

## 2024-06-17 LAB — BASIC METABOLIC PANEL WITH GFR
BUN: 14 mg/dL (ref 6–23)
CO2: 26 meq/L (ref 19–32)
Calcium: 9.2 mg/dL (ref 8.4–10.5)
Chloride: 105 meq/L (ref 96–112)
Creatinine, Ser: 1.04 mg/dL (ref 0.40–1.50)
GFR: 84.02 mL/min (ref >60.00)
Glucose, Bld: 105 mg/dL — ABNORMAL HIGH (ref 70–99)
Potassium: 3.9 meq/L (ref 3.5–5.1)
Sodium: 139 meq/L (ref 135–145)

## 2024-06-17 LAB — PSA: PSA: 0.6 ng/mL (ref 0.10–4.00)

## 2024-06-17 LAB — TSH: TSH: 1.47 u[IU]/mL (ref 0.35–5.50)

## 2024-06-17 LAB — VITAMIN B12: Vitamin B-12: >1500 pg/mL — ABNORMAL HIGH (ref 211–911)

## 2024-06-17 LAB — HEMOGLOBIN A1C: Hgb A1c MFr Bld: 5.3 % (ref 4.6–6.5)

## 2024-06-20 DIAGNOSIS — M5116 Intervertebral disc disorders with radiculopathy, lumbar region: Secondary | ICD-10-CM | POA: Diagnosis not present

## 2024-06-20 DIAGNOSIS — M9953 Intervertebral disc stenosis of neural canal of lumbar region: Secondary | ICD-10-CM | POA: Diagnosis not present

## 2024-06-20 DIAGNOSIS — M4316 Spondylolisthesis, lumbar region: Secondary | ICD-10-CM | POA: Diagnosis not present

## 2024-06-22 ENCOUNTER — Encounter: Admitting: Internal Medicine

## 2024-06-23 ENCOUNTER — Telehealth: Payer: Self-pay | Admitting: Internal Medicine

## 2024-06-23 NOTE — Telephone Encounter (Signed)
 Copied from CRM [id number]. Topic: General - Billing Inquiry >> Jun 23, 2024  1:26 PM Deaijah H wrote: Reason for CRM: Patient called in to check if he's gotten charged for missing 06/22/24 appointment, reached out to CAL was advised no he shouldn't, notified patient and he stated he's showing a $30 charge. Please call [phone] to verify

## 2024-07-04 ENCOUNTER — Encounter: Payer: Self-pay | Admitting: Internal Medicine

## 2024-07-04 ENCOUNTER — Ambulatory Visit (INDEPENDENT_AMBULATORY_CARE_PROVIDER_SITE_OTHER): Admitting: Internal Medicine

## 2024-07-04 VITALS — BP 142/100 | HR 69 | Temp 98.1°F | Ht 68.0 in | Wt 214.0 lb

## 2024-07-04 DIAGNOSIS — Z0000 Encounter for general adult medical examination without abnormal findings: Secondary | ICD-10-CM

## 2024-07-04 DIAGNOSIS — E559 Vitamin D deficiency, unspecified: Secondary | ICD-10-CM | POA: Diagnosis not present

## 2024-07-04 DIAGNOSIS — E7800 Pure hypercholesterolemia, unspecified: Secondary | ICD-10-CM

## 2024-07-04 DIAGNOSIS — R739 Hyperglycemia, unspecified: Secondary | ICD-10-CM

## 2024-07-04 DIAGNOSIS — Z125 Encounter for screening for malignant neoplasm of prostate: Secondary | ICD-10-CM

## 2024-07-04 DIAGNOSIS — I10 Essential (primary) hypertension: Secondary | ICD-10-CM

## 2024-07-04 DIAGNOSIS — Z0001 Encounter for general adult medical examination with abnormal findings: Secondary | ICD-10-CM

## 2024-07-04 DIAGNOSIS — Z1211 Encounter for screening for malignant neoplasm of colon: Secondary | ICD-10-CM

## 2024-07-04 DIAGNOSIS — E538 Deficiency of other specified B group vitamins: Secondary | ICD-10-CM

## 2024-07-04 NOTE — Progress Notes (Signed)
 Patient ID: Julian Swanson, male   DOB: [birth year], 48 y.o.   MRN: [id number]         Chief Complaint:: wellness exam and Annual Exam (Annual Exam. FYI of recent back/spine complications L3 protruding slightly and disk under slightly narrowing, physical therapy)  With chronic low back pain, htn, hld, hyperglycemia, low b12 and D       HPI:  Julian Swanson is a 48 y.o. male here for wellness exam; for cologuard o/w declines immunizations                        Also Pt continues to have recurring LBP without change in severity, bowel or bladder change, fever, wt loss,  worsening LE pain/numbness/weakness, gait change or falls, but has been referred to PT. Also sees chiropracter.  Has had MRI per orthopedic.    Pt denies chest pain, increased sob or doe, wheezing, orthopnea, PND, increased LE swelling, palpitations, dizziness or syncope.   Pt denies polydipsia, polyuria, or new focal neuro s/s.      Wt Readings from Last 3 Encounters:  07/04/24 214 lb (97.1 kg)  06/22/23 209 lb (94.8 kg)  06/05/22 209 lb (94.8 kg)   BP Readings from Last 3 Encounters:  07/04/24 (!) 142/100  06/22/23 128/76  06/05/22 140/80   Immunization History  Administered Date(s) Administered   Tdap 01/10/2019   There are no preventive care reminders to display for this patient.     Past Medical History:  Diagnosis Date   Allergic rhinitis 10/31/2019   HLD (hyperlipidemia) 10/31/2019   Hypertension    History reviewed. No pertinent surgical history.  reports that he has never smoked. He has never used smokeless tobacco. He reports that he does not drink alcohol and does not use drugs. family history includes Diabetes in an other family member; Hypertension in his father, mother, and another family member. Allergies  Allergen Reactions   Amoxicillin-Pot Clavulanate     Rash on thighs   Penicillins     See Augmentin reaction   Sulfa  Antibiotics     05/19/14 urticaria   Prednisone     No definitive  reaction 05/19/14 known reaction denied   Current Outpatient Medications on File Prior to Visit  Medication Sig Dispense Refill   Ascorbic Acid (VITAMIN C) 100 MG tablet Take 100 mg by mouth daily.     Cholecalciferol (D3 ADULT PO) Take 1 tablet by mouth daily.      KRILL OIL PO Take 1 tablet by mouth daily.      Magnesium 200 MG TABS 1 tablets with a meal Orally Once a day     metoprolol  succinate (TOPROL -XL) 25 MG 24 hr tablet Take 1 tablet (25 mg total) by mouth daily. 90 tablet 3   vitamin B-12 (CYANOCOBALAMIN ) 1000 MCG tablet Take 1 tablet (1,000 mcg total) by mouth daily. 90 tablet 3   Zinc Sulfate (ZINC 15 PO) Take 1 tablet by mouth daily.      cetirizine  (ZYRTEC  ALLERGY) 10 MG tablet Take 1 tablet (10 mg total) by mouth daily. (Patient not taking: Reported on 07/04/2024) 90 tablet 1   doxycycline  (VIBRA -TABS) 100 MG tablet Take 1 tablet (100 mg total) by mouth 2 (two) times daily. (Patient not taking: Reported on 07/04/2024) 20 tablet 0   metoprolol  tartrate (LOPRESSOR ) 25 MG tablet 25 mg. (Patient not taking: Reported on 07/04/2024)     pantoprazole  (PROTONIX ) 40 MG tablet Take 1 tablet (40 mg  total) by mouth daily. (Patient not taking: Reported on 07/04/2024) 90 tablet 3   No current facility-administered medications on file prior to visit.        ROS:  All others reviewed and negative.  Objective        PE:  BP (!) 142/100   Pulse 69   Temp 98.1 F (36.7 C)   Ht 5' 8 (1.727 m)   Wt 214 lb (97.1 kg)   SpO2 97%   BMI 32.54 kg/m                 Constitutional: Pt appears in NAD               HENT: Head: NCAT.                Right Ear: External ear normal.                 Left Ear: External ear normal.                Eyes: . Pupils are equal, round, and reactive to light. Conjunctivae and EOM are normal               Nose: without d/c or deformity               Neck: Neck supple. Gross normal ROM               Cardiovascular: Normal rate and regular rhythm.                  Pulmonary/Chest: Effort normal and breath sounds without rales or wheezing.                Abd:  Soft, NT, ND, + BS, no organomegaly               Neurological: Pt is alert. At baseline orientation, motor grossly intact               Skin: Skin is warm. No rashes, no other new lesions, LE edema - none               Psychiatric: Pt behavior is normal without agitation   Micro: none  Cardiac tracings I have personally interpreted today:  none  Pertinent Radiological findings (summarize): none   Lab Results  Component Value Date   WBC 5.2 06/17/2024   HGB 15.2 06/17/2024   HCT 45.0 06/17/2024   PLT 163.0 06/17/2024   GLUCOSE 105 (H) 06/17/2024   CHOL 195 06/17/2024   TRIG 95.0 06/17/2024   HDL 49.20 06/17/2024   LDLCALC 127 (H) 06/17/2024   ALT 18 06/17/2024   AST 19 06/17/2024   NA 139 06/17/2024   K 3.9 06/17/2024   CL 105 06/17/2024   CREATININE 1.04 06/17/2024   BUN 14 06/17/2024   CO2 26 06/17/2024   TSH 1.47 06/17/2024   PSA 0.60 06/17/2024   HGBA1C 5.3 06/17/2024   Assessment/Plan:  Julian Swanson is a 48 y.o. White or Caucasian [1] male with  has a past medical history of Allergic rhinitis (10/31/2019), HLD (hyperlipidemia) (10/31/2019), and Hypertension.  Encounter for general adult medical examination with abnormal findings Age and sex appropriate education and counseling updated with regular exercise and diet Referrals for preventative services - for cologuard Immunizations addressed - declines all immuinzations Smoking counseling  - none needed Evidence for depression or other mood disorder - none significant Most recent labs reviewed. I have personally reviewed  and have noted: 1) the patient's medical and social history 2) The patient's current medications and supplements 3) The patient's height, weight, and BMI have been recorded in the chart   Vitamin D  deficiency Last vitamin D  Lab Results  Component Value Date   VD25OH 42.73 06/17/2024   Stable,  cont oral replacement   Hyperglycemia Lab Results  Component Value Date   HGBA1C 5.3 06/17/2024   Stable, pt to continue current medical treatment  - diet, wt control   HLD (hyperlipidemia) Lab Results  Component Value Date   LDLCALC 127 (H) 06/17/2024   uncontrolled, pt for lower chol diet, decines statin or elective cardiac CT score for now which I think is reasonable   Essential hypertension BP Readings from Last 3 Encounters:  07/04/24 (!) 142/100  06/22/23 128/76  06/05/22 140/80   Uncontrolled, confirmed by insurance exam last wk, pt to continue medical treatment toprol  xl 25 mg daily - declines increase to 50 mg , will work on lifestyle changes   B12 deficiency Lab Results  Component Value Date   VITAMINB12 >1500 (H) 06/17/2024   Stable, cont oral replacement - b12 1000 mcg qd  Followup: Return in about 1 year (around 07/04/2025).  Lynwood Rush, MD 07/04/2024 2:09 PM [facility]

## 2024-07-04 NOTE — Assessment & Plan Note (Signed)
 BP Readings from Last 3 Encounters:  07/04/24 (!) 142/100  06/22/23 128/76  06/05/22 140/80   Uncontrolled, confirmed by insurance exam last wk, pt to continue medical treatment toprol  xl 25 mg daily - declines increase to 50 mg , will work on lifestyle changes

## 2024-07-04 NOTE — Assessment & Plan Note (Addendum)
 Lab Results  Component Value Date   LDLCALC 127 (H) 06/17/2024   uncontrolled, pt for lower chol diet, decines statin or elective cardiac CT score for now which I think is reasonable

## 2024-07-04 NOTE — Assessment & Plan Note (Signed)
 Last vitamin D  Lab Results  Component Value Date   VD25OH 42.73 06/17/2024   Stable, cont oral replacement

## 2024-07-04 NOTE — Assessment & Plan Note (Signed)
 Age and sex appropriate education and counseling updated with regular exercise and diet Referrals for preventative services - for cologuard Immunizations addressed - declines all immuinzations Smoking counseling  - none needed Evidence for depression or other mood disorder - none significant Most recent labs reviewed. I have personally reviewed and have noted: 1) the patient's medical and social history 2) The patient's current medications and supplements 3) The patient's height, weight, and BMI have been recorded in the chart

## 2024-07-04 NOTE — Patient Instructions (Addendum)
 Please continue all other medications as before, and refills have been done if requested.  Please have the pharmacy call with any other refills you may need.  Please continue your efforts at being more active, low cholesterol diet, and weight control.  You are otherwise up to date with prevention measures today.  Please keep your appointments with your specialists as you may have planned  You will be contacted regarding the referral for: cologuard  Please call if you would want the Ct Cardiac score  Please make an Appointment to return for your 1 year visit, or sooner if needed, with Lab testing by Appointment as well, to be done about 3-5 days before at the FIRST FLOOR Lab (so this is for TWO appointments - please see the scheduling desk as you leave)

## 2024-07-04 NOTE — Assessment & Plan Note (Signed)
 Lab Results  Component Value Date   VITAMINB12 >1500 (H) 06/17/2024   Stable, cont oral replacement - b12 1000 mcg qd

## 2024-07-04 NOTE — Assessment & Plan Note (Signed)
 Lab Results  Component Value Date   HGBA1C 5.3 06/17/2024   Stable, pt to continue current medical treatment  - diet, wt control

## 2024-07-08 ENCOUNTER — Other Ambulatory Visit: Payer: Self-pay | Admitting: Internal Medicine

## 2024-07-08 DIAGNOSIS — I10 Essential (primary) hypertension: Secondary | ICD-10-CM

## 2024-07-18 DIAGNOSIS — Z1211 Encounter for screening for malignant neoplasm of colon: Secondary | ICD-10-CM | POA: Diagnosis not present

## 2024-07-22 ENCOUNTER — Ambulatory Visit: Payer: Self-pay | Admitting: Internal Medicine

## 2024-07-22 LAB — COLOGUARD: COLOGUARD: NEGATIVE

## 2025-01-22 ENCOUNTER — Encounter: Payer: Self-pay | Admitting: Internal Medicine

## 2025-01-24 ENCOUNTER — Telehealth: Admitting: Physician Assistant

## 2025-01-24 DIAGNOSIS — J0100 Acute maxillary sinusitis, unspecified: Secondary | ICD-10-CM | POA: Diagnosis not present

## 2025-01-24 DIAGNOSIS — J209 Acute bronchitis, unspecified: Secondary | ICD-10-CM

## 2025-01-24 MED ORDER — PROMETHAZINE-DM 6.25-15 MG/5ML PO SYRP: 5.0000 mL | ORAL_SOLUTION | Freq: Four times a day (QID) | ORAL | 0 refills | Status: AC | PRN

## 2025-01-24 MED ORDER — DOXYCYCLINE HYCLATE 100 MG PO TABS: 100.0000 mg | ORAL_TABLET | Freq: Two times a day (BID) | ORAL | 0 refills | Status: AC

## 2025-01-24 MED ORDER — ALBUTEROL SULFATE HFA 108 (90 BASE) MCG/ACT IN AERS: 2.0000 | INHALATION_SPRAY | Freq: Four times a day (QID) | RESPIRATORY_TRACT | 0 refills | Status: AC | PRN

## 2025-07-05 ENCOUNTER — Encounter: Admitting: Internal Medicine
